# Patient Record
Sex: Female | Born: 2000 | Race: White | Hispanic: No | Marital: Single | Smoking: Former smoker
Health system: Southern US, Community
[De-identification: ages and names within clinical notes are randomized; demographics above are authoritative.]

## PROBLEM LIST (undated history)

## (undated) DIAGNOSIS — N946 Dysmenorrhea, unspecified: Secondary | ICD-10-CM

## (undated) DIAGNOSIS — G43009 Migraine without aura, not intractable, without status migrainosus: Secondary | ICD-10-CM

## (undated) DIAGNOSIS — E88819 Insulin resistance, unspecified: Secondary | ICD-10-CM

## (undated) DIAGNOSIS — F329 Major depressive disorder, single episode, unspecified: Secondary | ICD-10-CM

## (undated) DIAGNOSIS — F32A Depression, unspecified: Secondary | ICD-10-CM

## (undated) DIAGNOSIS — F419 Anxiety disorder, unspecified: Secondary | ICD-10-CM

## (undated) DIAGNOSIS — J45909 Unspecified asthma, uncomplicated: Secondary | ICD-10-CM

## (undated) DIAGNOSIS — S99919A Unspecified injury of unspecified ankle, initial encounter: Secondary | ICD-10-CM

## (undated) HISTORY — DX: Major depressive disorder, single episode, unspecified: F32.9

## (undated) HISTORY — PX: WISDOM TOOTH EXTRACTION: SHX21

## (undated) HISTORY — PX: APPENDECTOMY: SHX54

## (undated) HISTORY — DX: Migraine without aura, not intractable, without status migrainosus: G43.009

## (undated) HISTORY — DX: Anxiety disorder, unspecified: F41.9

## (undated) HISTORY — PX: EVALUATION UNDER ANESTHESIA WITH TEAR DUCT PROBING: SHX5620

## (undated) HISTORY — DX: Dysmenorrhea, unspecified: N94.6

## (undated) HISTORY — DX: Insulin resistance, unspecified: E88.819

## (undated) HISTORY — DX: Depression, unspecified: F32.A

---
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
## 2021-01-14 ENCOUNTER — Other Ambulatory Visit (HOSPITAL_COMMUNITY): Payer: Self-pay | Admitting: Psychiatry

## 2021-01-18 ENCOUNTER — Other Ambulatory Visit (HOSPITAL_COMMUNITY): Payer: Self-pay | Admitting: Psychiatry

## 2021-01-20 ENCOUNTER — Other Ambulatory Visit (HOSPITAL_COMMUNITY): Payer: Self-pay | Admitting: Psychiatry

## 2021-02-11 ENCOUNTER — Other Ambulatory Visit (HOSPITAL_COMMUNITY): Payer: Self-pay | Admitting: Psychiatry

## 2021-04-08 IMAGING — US US ABDOMEN LIMITED
1 series · 14 of 18 positions shown · non-contrast
Comparison: None.

CLINICAL DATA: Right lower quadrant pain for a day. Elevated white
blood cell count.

EXAM:
ULTRASOUND ABDOMEN LIMITED
TECHNIQUE: Gray scale imaging of the right lower quadrant was performed to
evaluate for suspected appendicitis. Standard imaging planes and
graded compression technique were utilized.

[Series 1: us abdomen limited · 18 acquisitions, 14 frames shown]
[im 1/18]
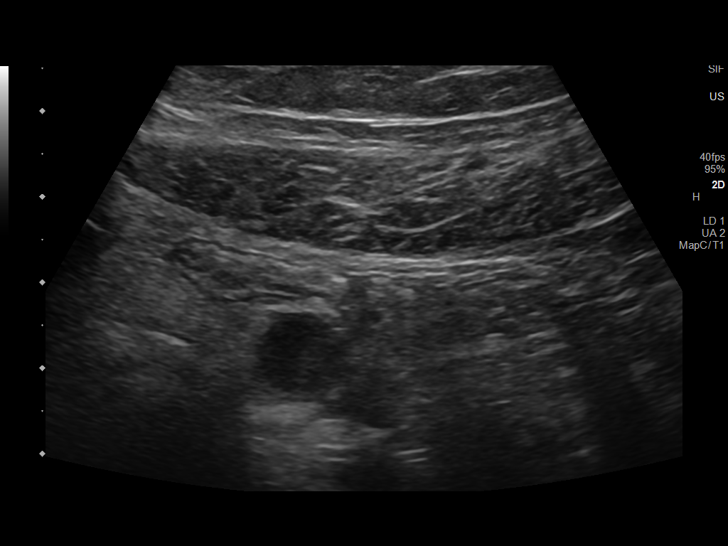
[im 2/18]
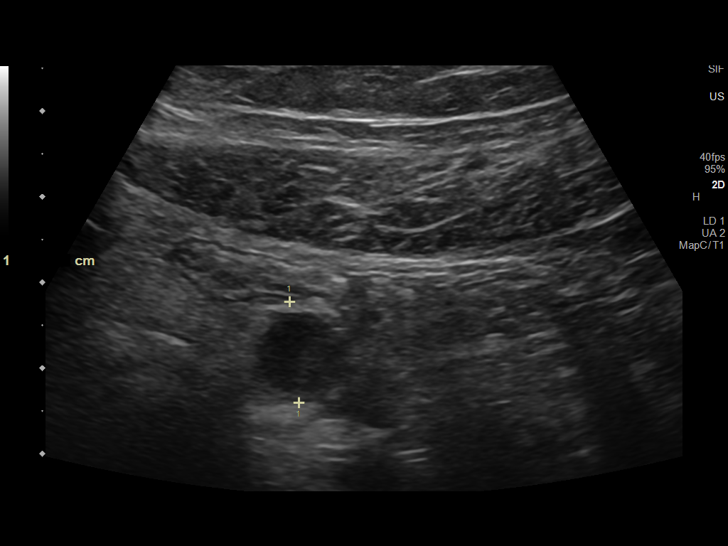
[im 4/18]
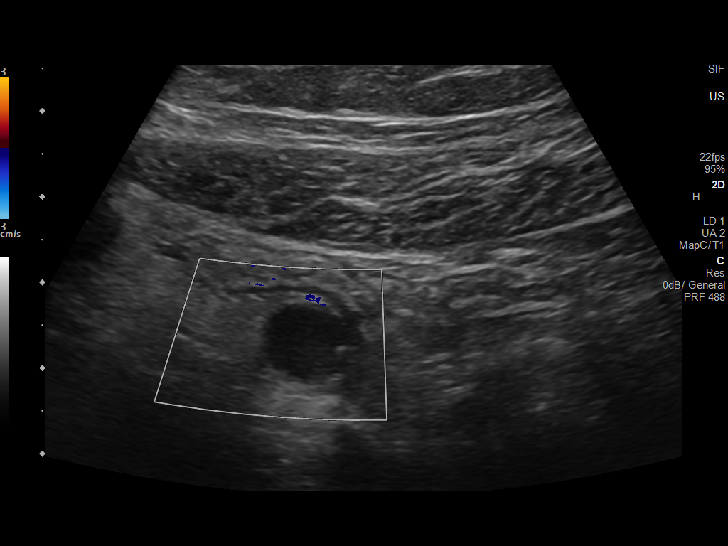
[im 5/18]
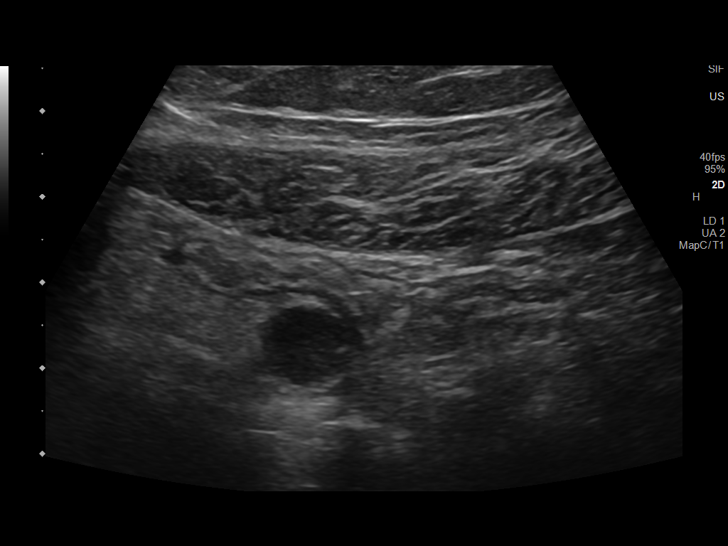
[im 6/18]
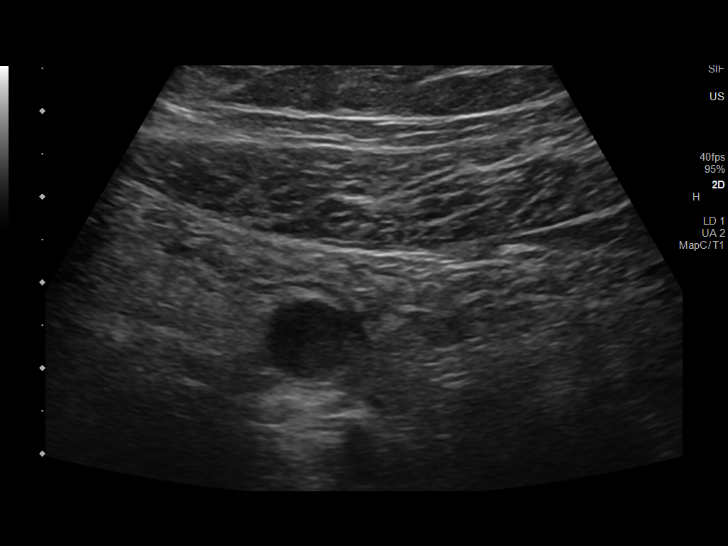
[im 8/18]
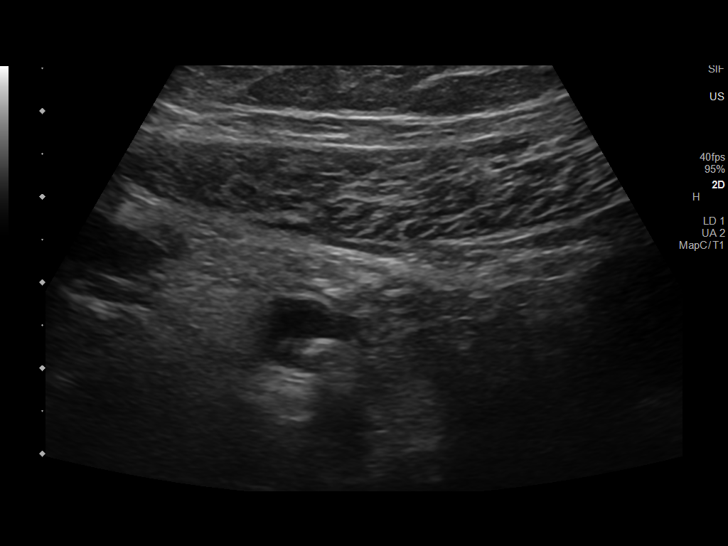
[im 9/18]
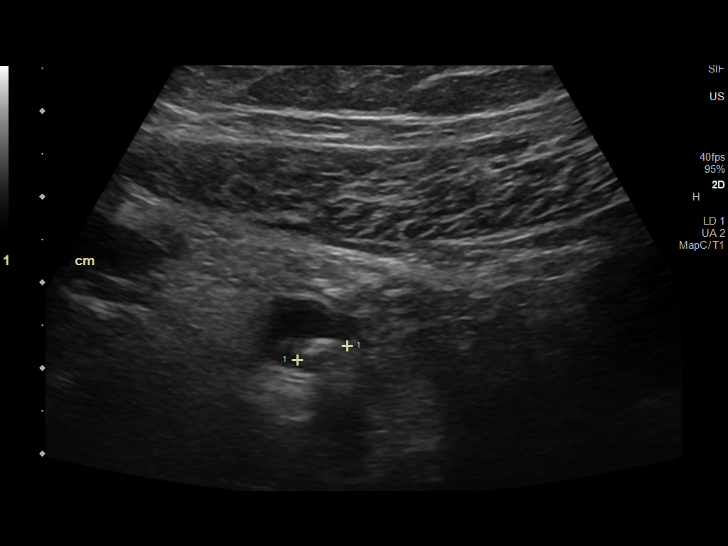
[im 10/18]
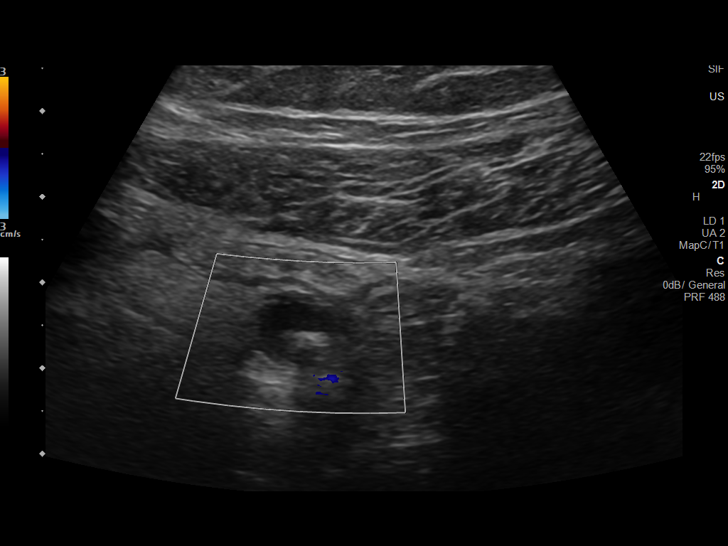
[im 11/18]
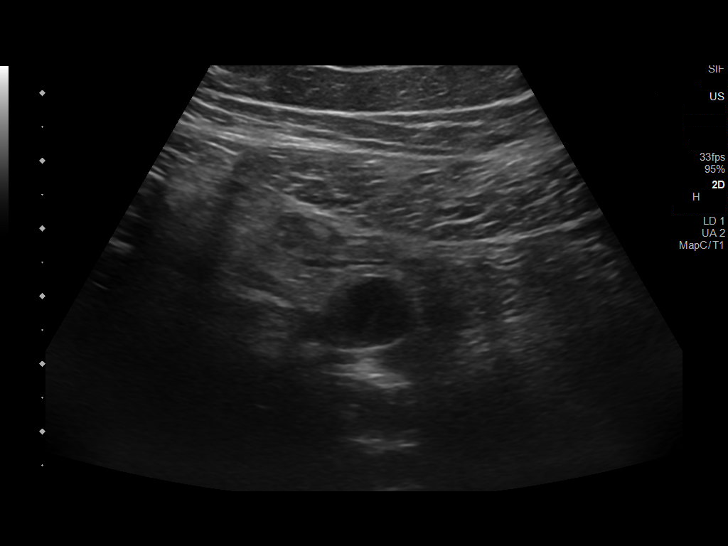
[im 13/18]
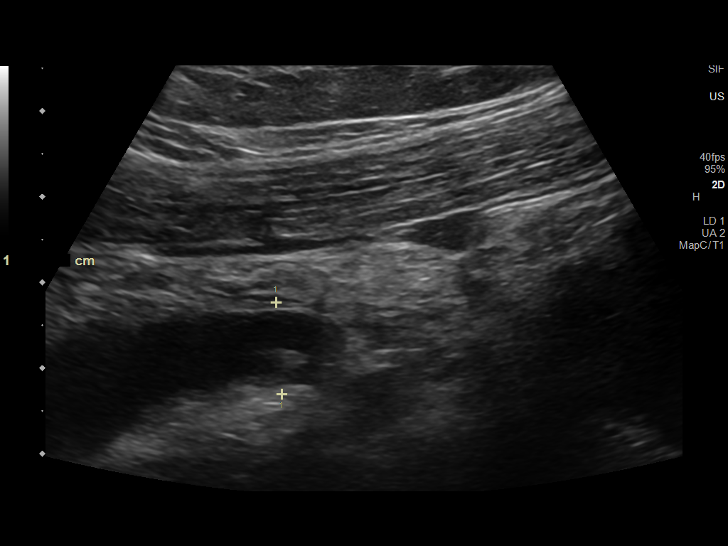
[im 14/18]
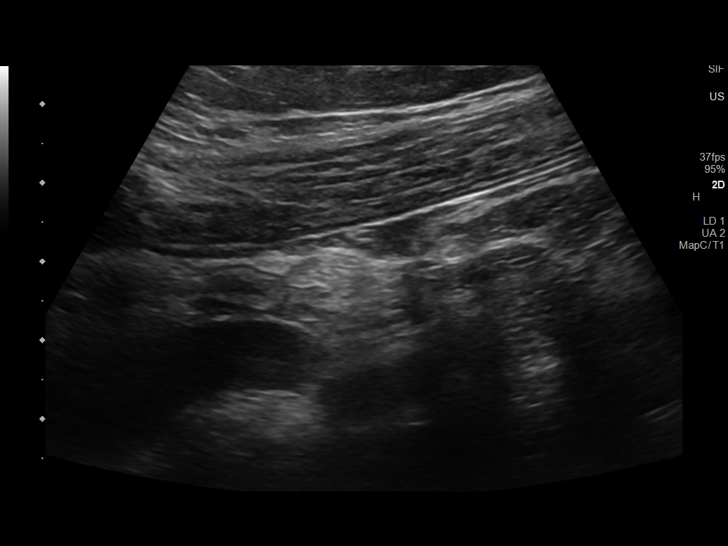
[im 15/18]
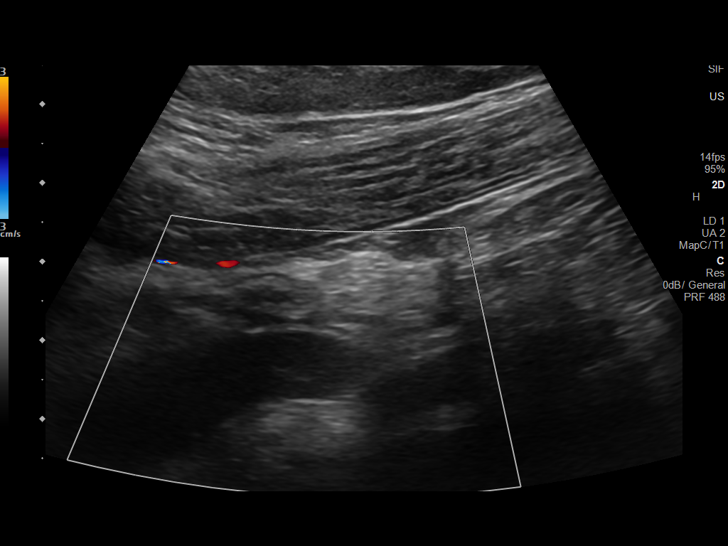
[im 17/18]
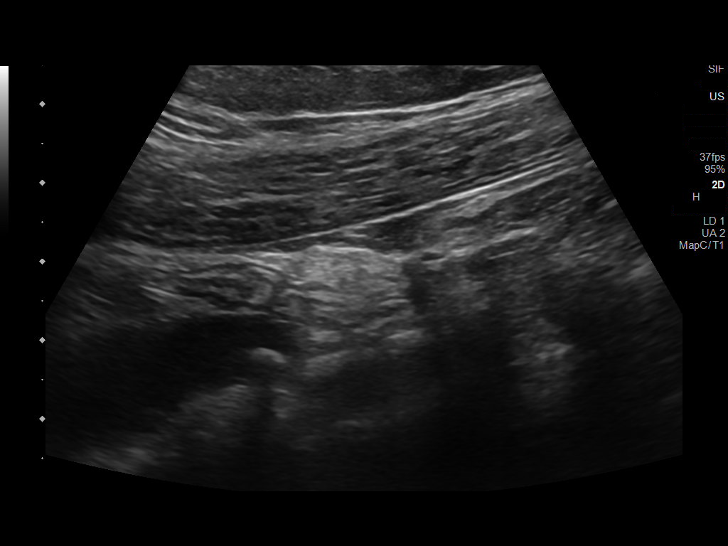
[im 18/18]
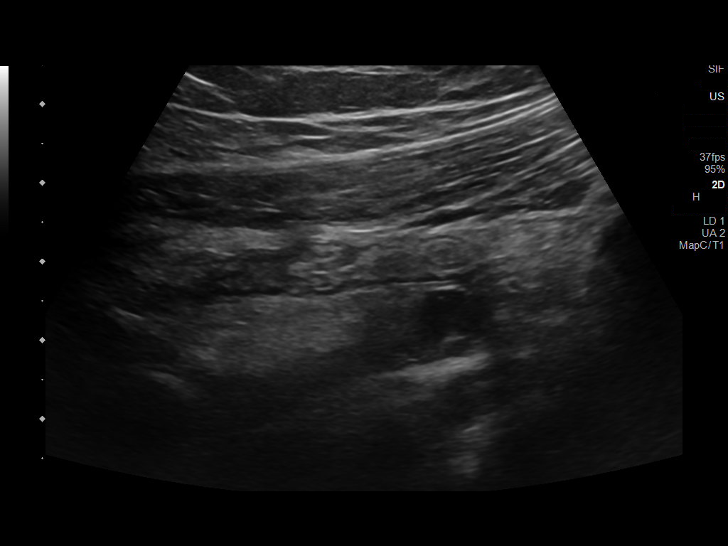

[14 of 18 positions shown; findings below may reference images not displayed]

FINDINGS: There is a blind ended tubular structure in the right lower quadrant
measuring 12 mm in diameter with some wall thickening and probably a
small amount of periappendiceal fluid. An apparent appendicoliths is
identified. This structure is noncompressible. There is tenderness
in this region with transducer pressure.
IMPRESSION: The findings are consistent with acute appendicitis with an
appendicolith in the mid appendix. The appendix measures 12 mm in
diameter with wall thickening and a small amount of adjacent fluid.

## 2021-09-08 ENCOUNTER — Encounter: Payer: Self-pay | Admitting: Obstetrics and Gynecology

## 2021-09-08 ENCOUNTER — Ambulatory Visit: Payer: BC Managed Care – PPO | Admitting: Obstetrics and Gynecology

## 2021-09-08 VITALS — BP 120/78 | HR 76 | Ht 65.0 in | Wt 136.0 lb

## 2021-09-08 DIAGNOSIS — N926 Irregular menstruation, unspecified: Secondary | ICD-10-CM | POA: Diagnosis not present

## 2021-09-08 DIAGNOSIS — Z113 Encounter for screening for infections with a predominantly sexual mode of transmission: Secondary | ICD-10-CM

## 2021-09-08 LAB — PREGNANCY, URINE: Preg Test, Ur: NEGATIVE

## 2021-09-08 NOTE — Progress Notes (Signed)
GYNECOLOGY  VISIT ?  ?HPI: ?21 y.o.   Single White or Caucasian Not Hispanic or Latino  female   ?G0P0000 with Patient's last menstrual period was 09/05/2021.   ?here for  STD testing.   ?Recently with an abnormal cycle, just spotted. She has had some missed pills.  ? ?She is done with her first year in college, [employer].  ? ?GYNECOLOGIC HISTORY: ?Patient's last menstrual period was 09/05/2021. ?Contraception:OCP ?Menopausal hormone therapy: none  ?       ?OB History   ? ? Gravida  ?0  ? Para  ?0  ? Term  ?0  ? Preterm  ?0  ? AB  ?0  ? Living  ?0  ?  ? ? SAB  ?0  ? IAB  ?0  ? Ectopic  ?0  ? Multiple  ?0  ? Live Births  ?0  ?   ?  ?  ?    ? ?Patient Active Problem List  ? Diagnosis Date Noted  ? Major depressive disorder, recurrent episode, in full remission (HCC) 11/12/2019  ? GAD (generalized anxiety disorder) 11/12/2019  ? Acute appendicitis 12/30/2018  ? ? ?Past Medical History:  ?Diagnosis Date  ? Ankle injury   ? Anxiety   ? Asthma   ? Depression   ? Menstrual cramps   ? Migraine without aura   ? ? ?Past Surgical History:  ?Procedure Laterality Date  ? EVALUATION UNDER ANESTHESIA WITH TEAR DUCT PROBING    ? LAPAROSCOPIC APPENDECTOMY N/A 12/30/2018  ? Procedure: APPENDECTOMY LAPAROSCOPIC;  Surgeon: Leonia Corona, MD;  [facility];  Service: Pediatrics;  Laterality: N/A;  ? ? ?Current Outpatient Medications  ?Medication Sig Dispense Refill  ? buPROPion (WELLBUTRIN XL) 300 MG 24 hr tablet TAKE 1 TABLET BY MOUTH EVERY DAY IN THE MORNING 30 tablet 0  ? busPIRone (BUSPAR) 30 MG tablet Take 30 mg by mouth 2 (two) times daily.    ? drospirenone-ethinyl estradiol (NIKKI) 3-0.02 MG tablet Take 1 tablet by mouth daily. 84 tablet 3  ? hydrOXYzine (VISTARIL) 25 MG capsule TAKE 1 CAPSULE BY MOUTH EVERY EVENING AS NEEDED FOR ANXIETY/INSOMNIA 90 capsule 2  ? QVAR REDIHALER 80 MCG/ACT inhaler SMARTSIG:2 Puff(s) By Mouth Twice Daily    ? ?No current facility-administered medications for this visit.  ?  ? ?ALLERGIES: Patient has  no known allergies. ? ?Family History  ?Problem Relation Age of Onset  ? Diabetes Maternal Grandfather   ? Kidney failure Paternal Grandfather   ? ? ?Social History  ? ?Socioeconomic History  ? Marital status: Single  ?  Spouse name: Not on file  ? Number of children: Not on file  ? Years of education: Not on file  ? Highest education level: Not on file  ?Occupational History  ? Not on file  ?Tobacco Use  ? Smoking status: Never  ? Smokeless tobacco: Never  ?Vaping Use  ? Vaping Use: Never used  ?Substance and Sexual Activity  ? Alcohol use: Never  ? Drug use: Never  ? Sexual activity: Yes  ?  Birth control/protection: None  ?Other Topics Concern  ? Not on file  ?Social History Narrative  ? Not on file  ? ?Social Determinants of Health  ? ?Financial Resource Strain: Not on file  ?Food Insecurity: Not on file  ?Transportation Needs: Not on file  ?Physical Activity: Not on file  ?Stress: Not on file  ?Social Connections: Not on file  ?Intimate Partner Violence: Not on file  ? ? ?  Review of Systems  ?All other systems reviewed and are negative. ? ?PHYSICAL EXAMINATION:   ? ?BP 120/78   Pulse 76   Ht 5\' 5"  (1.651 m)   Wt 136 lb (61.7 kg)   LMP 09/05/2021   SpO2 100%   BMI 22.63 kg/m?     ?General appearance: alert, cooperative and appears stated age ? ? ?Pelvic: External genitalia:  no lesions ?             Urethra:  normal appearing urethra with no masses, tenderness or lesions ?             Bartholins and Skenes: normal    ?             Vagina: normal appearing vagina with normal color. Slight amount of watery vaginal d/c (denies symptoms) ?             Cervix: no lesions ?              ?Chaperone was present for exam. ? ?1. Screening examination for STD (sexually transmitted disease) ?- SURESWAB CT/NG/T. vaginalis ?- RPR ?- HIV Antibody (routine testing w rflx) ?- Hepatitis C antibody ? ?2. Irregular menses ?- Pregnancy, urine ? ?

## 2021-09-09 LAB — RPR: RPR Ser Ql: NONREACTIVE

## 2021-09-09 LAB — HEPATITIS C ANTIBODY
Hepatitis C Ab: NONREACTIVE
SIGNAL TO CUT-OFF: 0.15 (ref <1.00)

## 2021-09-09 LAB — SURESWAB CT/NG/T. VAGINALIS
C. trachomatis RNA, TMA: NOT DETECTED
N. gonorrhoeae RNA, TMA: NOT DETECTED
Trichomonas vaginalis RNA: NOT DETECTED

## 2021-09-09 LAB — HIV ANTIBODY (ROUTINE TESTING W REFLEX): HIV 1&2 Ab, 4th Generation: NONREACTIVE

## 2021-10-31 ENCOUNTER — Telehealth: Payer: Self-pay

## 2021-10-31 ENCOUNTER — Telehealth: Payer: Self-pay | Admitting: Psychiatry

## 2021-10-31 MED ORDER — BUPROPION HCL ER (XL) 300 MG PO TB24: ORAL_TABLET | ORAL | 0 refills | Status: DC

## 2021-10-31 NOTE — Telephone Encounter (Signed)
Spoke with patient and let her know Dr. Oscar La sent Rx.  Reminded her of her AEX scheduled 11/16/21 at 11am with Dr. Oscar La.

## 2021-10-31 NOTE — Telephone Encounter (Signed)
Called patient at request of nurse to schedule new patient appointment. Previous patient of now retired provider and needs refills on meds stating she has 5 days remaining. First available appointment is 12/02/2021.  Supplied number and address of [facility] to explore urgent refill request and scheduled appointment for 12/02/2021.

## 2021-10-31 NOTE — Telephone Encounter (Signed)
Patient and her Mom (in background) called.  Patient is out of her antidepressant. Has only one day left.  She said her psychiatrist retired and she cannot get an appointment until January with Dr. Lafayette Dragon. Dr. Lafayette Dragon suggested she call you and see if you might give her a short term Rx until she can see him. Patient reports she is taking Wellbutrin XL 300 mg. Pharmacy confirmed.  Last AEX 11/09/21. Scheduled 11/16/2021

## 2021-11-02 ENCOUNTER — Telehealth (HOSPITAL_COMMUNITY): Payer: Self-pay | Admitting: Psychiatry

## 2021-11-09 NOTE — Progress Notes (Signed)
21 y.o. G0P0000 Single White or Caucasian Not Hispanic or Latino female here for annual exam.  She would also like STD testing.  Period Cycle (Days): 28 Period Duration (Days): 5 Period Pattern: Regular Menstrual Flow: Moderate Menstrual Control: Tampon Menstrual Control Change Freq (Hours): 6 Dysmenorrhea: (!) Moderate (cramping has been getting worse.) Dysmenorrhea Symptoms: Cramping, Diarrhea, Headache Cramps are worsening. Allergic to NSAID's.   Patient's last menstrual period was 11/02/2021.          Sexually active: Yes.    The current method of family planning is OCP (estrogen/progesterone).    Exercising: Yes.    Gym/ health club routine includes cardio. Smoker:  vaping   Health Maintenance: Pap:  n/a History of abnormal Pap:  N/A MMG:  N/A BMD:   n/a Colonoscopy: n/a TDaP:  2014 Gardasil: x2   reports that she has never smoked. She has never used smokeless tobacco. She reports that she does not drink alcohol and does not use drugs. Occasional ETOH. She is at [employer], 2 more years. Major in Criminal Justice. Wants to go into [employer].   Past Medical History:  Diagnosis Date   Ankle injury    Anxiety    Asthma    Depression    Menstrual cramps    Migraine without aura     Past Surgical History:  Procedure Laterality Date   EVALUATION UNDER ANESTHESIA WITH TEAR DUCT PROBING     LAPAROSCOPIC APPENDECTOMY N/A 12/30/2018   Procedure: APPENDECTOMY LAPAROSCOPIC;  Surgeon: Leonia Corona, MD;  Location: [facility];  Service: Pediatrics;  Laterality: N/A;    Current Outpatient Medications  Medication Sig Dispense Refill   buPROPion (WELLBUTRIN XL) 300 MG 24 hr tablet TAKE 1 TABLET BY MOUTH EVERY DAY IN THE MORNING 90 tablet 0   busPIRone (BUSPAR) 30 MG tablet Take 30 mg by mouth 2 (two) times daily.     drospirenone-ethinyl estradiol (NIKKI) 3-0.02 MG tablet Take 1 tablet by mouth daily. 84 tablet 3   hydrOXYzine (VISTARIL) 25 MG capsule TAKE 1 CAPSULE BY MOUTH EVERY EVENING AS  NEEDED FOR ANXIETY/INSOMNIA 90 capsule 2   QVAR REDIHALER 80 MCG/ACT inhaler SMARTSIG:2 Puff(s) By Mouth Twice Daily     No current facility-administered medications for this visit.    Family History  Problem Relation Age of Onset   Diabetes Maternal Grandfather    Kidney failure Paternal Grandfather     Review of Systems  All other systems reviewed and are negative.   Exam:   BP 110/68   Pulse 77   Ht 5\' 5"  (1.651 m)   Wt 136 lb (61.7 kg)   LMP 11/02/2021   SpO2 100%   BMI 22.63 kg/m   Weight change: @WEIGHTCHANGE @ Height:   Height: 5\' 5"  (165.1 cm)  Ht Readings from Last 3 Encounters:  11/16/21 5\' 5"  (1.651 m)  09/08/21 5\' 5"  (1.651 m) (61 %, Z= 0.27)*  11/09/20 5\' 5"  (1.651 m) (61 %, Z= 0.28)*   * Growth percentiles are based on CDC (Girls, 2-20 Years) data.    General appearance: alert, cooperative and appears stated age Head: Normocephalic, without obvious abnormality, atraumatic Neck: no adenopathy, supple, symmetrical, trachea midline and thyroid normal to inspection and palpation Lungs: clear to auscultation bilaterally Cardiovascular: regular rate and rhythm Breasts: normal appearance, no masses or tenderness Abdomen: soft, non-tender; non distended,  no masses,  no organomegaly Extremities: extremities normal, atraumatic, no cyanosis or edema Skin: Skin color, texture, turgor normal. No rashes or lesions Lymph nodes: Cervical,  supraclavicular, and axillary nodes normal. No abnormal inguinal nodes palpated Neurologic: Grossly normal   Pelvic: External genitalia:  no lesions              Urethra:  normal appearing urethra with no masses, tenderness or lesions              Bartholins and Skenes: normal                 Vagina: normal appearing vagina with normal color and discharge, no lesions              Cervix: no lesions               Bimanual Exam:  Uterus:  normal size, contour, position, consistency, mobility, non-tender              Adnexa: no mass,  fullness, tenderness                Carolynn Serve chaperoned for the exam.  1. Well woman exam Discussed breast self exam Discussed calcium and vit D intake No screening labs  2. Encounter for surveillance of contraceptive pills Having worsening dysmenorrhea on monthly OCP's, has an allergy to NSAID's. Discussed changing to a 3 month pill or trying an IUD. She would like to change to the 3 month pill.  - Levonorgestrel-Ethinyl Estradiol (LOSEASONIQUE) 0.1-0.02 & 0.01 MG tablet; Take 1 tablet by mouth daily.  Dispense: 91 tablet; Refill: 4  3. Dysmenorrhea Having worsening dysmenorrhea on monthly OCP's, has an allergy to NSAID's. Discussed changing to a 3 month pill or trying an IUD. She would like to change to the 3 month pill.  - Levonorgestrel-Ethinyl Estradiol (LOSEASONIQUE) 0.1-0.02 & 0.01 MG tablet; Take 1 tablet by mouth daily.  Dispense: 91 tablet; Refill: 4  4. Screening examination for STD (sexually transmitted disease) Declines blood work - SURESWAB CT/NG/T. Vaginalis -Condom use discussed  5. Immunization due - HPV 9-valent vaccine,Recombinat

## 2021-11-16 ENCOUNTER — Ambulatory Visit (INDEPENDENT_AMBULATORY_CARE_PROVIDER_SITE_OTHER): Payer: BC Managed Care – PPO | Admitting: Obstetrics and Gynecology

## 2021-11-16 ENCOUNTER — Encounter: Payer: Self-pay | Admitting: Obstetrics and Gynecology

## 2021-11-16 VITALS — BP 110/68 | HR 77 | Ht 65.0 in | Wt 136.0 lb

## 2021-11-16 DIAGNOSIS — Z3041 Encounter for surveillance of contraceptive pills: Secondary | ICD-10-CM

## 2021-11-16 DIAGNOSIS — Z01419 Encounter for gynecological examination (general) (routine) without abnormal findings: Secondary | ICD-10-CM

## 2021-11-16 DIAGNOSIS — Z23 Encounter for immunization: Secondary | ICD-10-CM | POA: Diagnosis not present

## 2021-11-16 DIAGNOSIS — N946 Dysmenorrhea, unspecified: Secondary | ICD-10-CM

## 2021-11-16 DIAGNOSIS — Z113 Encounter for screening for infections with a predominantly sexual mode of transmission: Secondary | ICD-10-CM

## 2021-11-16 MED ORDER — LEVONORGEST-ETH ESTRAD 91-DAY 0.1-0.02 & 0.01 MG PO TABS: 1.0000 | ORAL_TABLET | Freq: Every day | ORAL | 4 refills | Status: DC

## 2021-11-16 NOTE — Patient Instructions (Signed)

## 2021-11-17 LAB — SURESWAB CT/NG/T. VAGINALIS
C. trachomatis RNA, TMA: NOT DETECTED
N. gonorrhoeae RNA, TMA: NOT DETECTED
Trichomonas vaginalis RNA: NOT DETECTED

## 2021-12-02 ENCOUNTER — Ambulatory Visit (HOSPITAL_COMMUNITY): Payer: BC Managed Care – PPO | Admitting: Psychiatry

## 2021-12-18 ENCOUNTER — Other Ambulatory Visit: Payer: Self-pay | Admitting: Obstetrics and Gynecology

## 2021-12-18 DIAGNOSIS — Z3041 Encounter for surveillance of contraceptive pills: Secondary | ICD-10-CM

## 2021-12-19 NOTE — Telephone Encounter (Signed)
Refill request received for OCP Nikki.   Per review of AEX 11/16/21, changed to [facility].   Refill refused.   Routing to Dr. Shirley Friar.   Encounter closed.

## 2021-12-30 ENCOUNTER — Telehealth: Payer: Self-pay | Admitting: *Deleted

## 2021-12-30 NOTE — Telephone Encounter (Signed)
Patient called stating she still hasn't found a psychiatrists yet. She asked if you would refill her Buspar 30 mg tablet for her? Please advise

## 2022-01-03 ENCOUNTER — Other Ambulatory Visit: Payer: Self-pay | Admitting: Obstetrics and Gynecology

## 2022-01-03 MED ORDER — BUSPIRONE HCL 30 MG PO TABS: 30.0000 mg | ORAL_TABLET | Freq: Two times a day (BID) | ORAL | 0 refills | Status: DC

## 2022-01-03 NOTE — Telephone Encounter (Signed)
Please let her know that I have called in a 3 month supply to give her time to establish care with Psychiatrist. Please send her a list of Psychiatrists.

## 2022-01-03 NOTE — Telephone Encounter (Signed)
Patient called back and said she would prefer Rx sent to [facility] [place] location patient she is currently at school now.  Patient said she does have an appointment in Jan 2024 Psychiatrists. But would like a names.I told her I would send via my chart.

## 2022-01-03 NOTE — Telephone Encounter (Signed)
Left detailed message on patient voicemail per DPR access.  Asked her to call if she needs Psychiatrists

## 2022-01-25 ENCOUNTER — Other Ambulatory Visit: Payer: Self-pay | Admitting: Obstetrics and Gynecology

## 2022-02-03 ENCOUNTER — Inpatient Hospital Stay (HOSPITAL_COMMUNITY)
Admission: RE | Admit: 2022-02-03 | Discharge: 2022-02-08 | DRG: 885 | Disposition: A | Discharge status: Home / Self Care | Payer: BC Managed Care – PPO | Source: Intra-hospital | Attending: Psychiatry | Admitting: Psychiatry

## 2022-02-03 ENCOUNTER — Ambulatory Visit (HOSPITAL_COMMUNITY)
Admission: EM | Admit: 2022-02-03 | Discharge: 2022-02-03 | Disposition: A | Discharge status: Other Acute Inpatient Hospital | Payer: BC Managed Care – PPO | Attending: Behavioral Health | Admitting: Behavioral Health

## 2022-02-03 ENCOUNTER — Ambulatory Visit (HOSPITAL_COMMUNITY)
Admission: EM | Admit: 2022-02-03 | Discharge: 2022-02-03 | Disposition: A | Discharge status: Home / Self Care | Payer: BC Managed Care – PPO | Attending: Behavioral Health | Admitting: Behavioral Health

## 2022-02-03 DIAGNOSIS — F411 Generalized anxiety disorder: Secondary | ICD-10-CM

## 2022-02-03 DIAGNOSIS — J45909 Unspecified asthma, uncomplicated: Secondary | ICD-10-CM | POA: Diagnosis present

## 2022-02-03 DIAGNOSIS — Z79899 Other long term (current) drug therapy: Secondary | ICD-10-CM

## 2022-02-03 DIAGNOSIS — Z1152 Encounter for screening for COVID-19: Secondary | ICD-10-CM | POA: Diagnosis not present

## 2022-02-03 DIAGNOSIS — F3342 Major depressive disorder, recurrent, in full remission: Secondary | ICD-10-CM | POA: Insufficient documentation

## 2022-02-03 DIAGNOSIS — F331 Major depressive disorder, recurrent, moderate: Secondary | ICD-10-CM | POA: Diagnosis present

## 2022-02-03 DIAGNOSIS — R45851 Suicidal ideations: Secondary | ICD-10-CM | POA: Diagnosis present

## 2022-02-03 DIAGNOSIS — E282 Polycystic ovarian syndrome: Secondary | ICD-10-CM | POA: Diagnosis present

## 2022-02-03 DIAGNOSIS — F17290 Nicotine dependence, other tobacco product, uncomplicated: Secondary | ICD-10-CM | POA: Diagnosis present

## 2022-02-03 DIAGNOSIS — Z20822 Contact with and (suspected) exposure to covid-19: Secondary | ICD-10-CM | POA: Diagnosis present

## 2022-02-03 DIAGNOSIS — F332 Major depressive disorder, recurrent severe without psychotic features: Secondary | ICD-10-CM | POA: Diagnosis present

## 2022-02-03 LAB — CBC WITH DIFFERENTIAL/PLATELET
Abs Immature Granulocytes: 0.01 10*3/uL (ref 0.00–0.07)
Basophils Absolute: 0 10*3/uL (ref 0.0–0.1)
Basophils Relative: 1 %
Eosinophils Absolute: 0.1 10*3/uL (ref 0.0–0.5)
Eosinophils Relative: 2 %
HCT: 40.9 % (ref 36.0–46.0)
Hemoglobin: 13.6 g/dL (ref 12.0–15.0)
Immature Granulocytes: 0 %
Lymphocytes Relative: 30 %
Lymphs Abs: 1.9 10*3/uL (ref 0.7–4.0)
MCH: 29.9 pg (ref 26.0–34.0)
MCHC: 33.3 g/dL (ref 30.0–36.0)
MCV: 89.9 fL (ref 80.0–100.0)
Monocytes Absolute: 0.4 10*3/uL (ref 0.1–1.0)
Monocytes Relative: 7 %
Neutro Abs: 3.8 10*3/uL (ref 1.7–7.7)
Neutrophils Relative %: 60 %
Platelets: 316 10*3/uL (ref 150–400)
RBC: 4.55 MIL/uL (ref 3.87–5.11)
RDW: 11.9 % (ref 11.5–15.5)
WBC: 6.2 10*3/uL (ref 4.0–10.5)
nRBC: 0 % (ref 0.0–0.2)

## 2022-02-03 LAB — COMPREHENSIVE METABOLIC PANEL
ALT: 12 U/L (ref 0–44)
AST: 14 U/L — ABNORMAL LOW (ref 15–41)
Albumin: 4.1 g/dL (ref 3.5–5.0)
Alkaline Phosphatase: 36 U/L — ABNORMAL LOW (ref 38–126)
Anion gap: 8 (ref 5–15)
BUN: 11 mg/dL (ref 6–20)
CO2: 25 mmol/L (ref 22–32)
Calcium: 9.7 mg/dL (ref 8.9–10.3)
Chloride: 105 mmol/L (ref 98–111)
Creatinine, Ser: 0.8 mg/dL (ref 0.44–1.00)
GFR, Estimated: >60 mL/min (ref >60)
Glucose, Bld: 60 mg/dL — ABNORMAL LOW (ref 70–99)
Potassium: 4.1 mmol/L (ref 3.5–5.1)
Sodium: 138 mmol/L (ref 135–145)
Total Bilirubin: 0.4 mg/dL (ref 0.3–1.2)
Total Protein: 7.3 g/dL (ref 6.5–8.1)

## 2022-02-03 LAB — LIPID PANEL
Cholesterol: 222 mg/dL — ABNORMAL HIGH (ref 0–200)
HDL: 54 mg/dL (ref >40)
LDL Cholesterol: 121 mg/dL — ABNORMAL HIGH (ref 0–99)
Total CHOL/HDL Ratio: 4.1 RATIO
Triglycerides: 237 mg/dL — ABNORMAL HIGH (ref <150)
VLDL: 47 mg/dL — ABNORMAL HIGH (ref 0–40)

## 2022-02-03 LAB — POCT URINE DRUG SCREEN - MANUAL ENTRY (I-SCREEN)
POC Amphetamine UR: NOT DETECTED
POC Buprenorphine (BUP): NOT DETECTED
POC Cocaine UR: NOT DETECTED
POC Marijuana UR: NOT DETECTED
POC Methadone UR: NOT DETECTED
POC Methamphetamine UR: NOT DETECTED
POC Morphine: NOT DETECTED
POC Oxazepam (BZO): NOT DETECTED
POC Oxycodone UR: NOT DETECTED
POC Secobarbital (BAR): NOT DETECTED

## 2022-02-03 LAB — ETHANOL: Alcohol, Ethyl (B): <10 mg/dL (ref <10)

## 2022-02-03 LAB — POC SARS CORONAVIRUS 2 AG: SARSCOV2ONAVIRUS 2 AG: NEGATIVE

## 2022-02-03 LAB — TSH: TSH: 0.358 u[IU]/mL (ref 0.350–4.500)

## 2022-02-03 LAB — POCT PREGNANCY, URINE: Preg Test, Ur: NEGATIVE

## 2022-02-03 LAB — RESP PANEL BY RT-PCR (FLU A&B, COVID) ARPGX2
Influenza A by PCR: NEGATIVE
Influenza B by PCR: NEGATIVE
SARS Coronavirus 2 by RT PCR: NEGATIVE

## 2022-02-03 LAB — HEMOGLOBIN A1C
Hgb A1c MFr Bld: 4.4 % — ABNORMAL LOW (ref 4.8–5.6)
Mean Plasma Glucose: 79.58 mg/dL

## 2022-02-03 MED ORDER — ACETAMINOPHEN 325 MG PO TABS
650.0000 mg | ORAL_TABLET | Freq: Four times a day (QID) | ORAL | Status: DC | PRN
  Administered 2022-02-04 – 2022-02-08 (×2): 650 mg via ORAL
  Filled 2022-02-03 (×2): qty 2

## 2022-02-03 MED ORDER — LEVONORGEST-ETH ESTRAD 91-DAY 0.1-0.02 & 0.01 MG PO TABS
1.0000 | ORAL_TABLET | Freq: Every day | ORAL | Status: DC
  Administered 2022-02-03: 1 via ORAL

## 2022-02-03 MED ORDER — MAGNESIUM HYDROXIDE 400 MG/5ML PO SUSP: 30.0000 mL | Freq: Every day | ORAL | Status: DC | PRN

## 2022-02-03 MED ORDER — BUSPIRONE HCL 15 MG PO TABS
30.0000 mg | ORAL_TABLET | Freq: Two times a day (BID) | ORAL | Status: DC
  Administered 2022-02-04 – 2022-02-08 (×9): 30 mg via ORAL
  Filled 2022-02-03 (×13): qty 2

## 2022-02-03 MED ORDER — TRAZODONE HCL 50 MG PO TABS: 50.0000 mg | ORAL_TABLET | Freq: Every evening | ORAL | Status: DC | PRN

## 2022-02-03 MED ORDER — ALBUTEROL SULFATE HFA 108 (90 BASE) MCG/ACT IN AERS
2.0000 | INHALATION_SPRAY | Freq: Four times a day (QID) | RESPIRATORY_TRACT | Status: DC | PRN
  Filled 2022-02-03: qty 6.7

## 2022-02-03 MED ORDER — BUPROPION HCL ER (XL) 300 MG PO TB24
300.0000 mg | ORAL_TABLET | Freq: Every day | ORAL | Status: DC
  Administered 2022-02-04 – 2022-02-08 (×5): 300 mg via ORAL
  Filled 2022-02-03 (×7): qty 1

## 2022-02-03 MED ORDER — HYDROXYZINE HCL 25 MG PO TABS: 25.0000 mg | ORAL_TABLET | Freq: Three times a day (TID) | ORAL | Status: DC | PRN

## 2022-02-03 MED ORDER — ALUM & MAG HYDROXIDE-SIMETH 200-200-20 MG/5ML PO SUSP: 30.0000 mL | ORAL | Status: DC | PRN

## 2022-02-03 MED ORDER — HYDROXYZINE HCL 25 MG PO TABS
25.0000 mg | ORAL_TABLET | Freq: Three times a day (TID) | ORAL | Status: DC | PRN
  Administered 2022-02-04: 25 mg via ORAL
  Filled 2022-02-03 (×2): qty 1

## 2022-02-03 MED ORDER — LEVONORGEST-ETH ESTRAD 91-DAY 0.1-0.02 & 0.01 MG PO TABS
1.0000 | ORAL_TABLET | Freq: Every day | ORAL | Status: DC
  Administered 2022-02-05 – 2022-02-06 (×2): 1 via ORAL

## 2022-02-03 MED ORDER — BUPROPION HCL ER (XL) 300 MG PO TB24: 300.0000 mg | ORAL_TABLET | Freq: Every day | ORAL | Status: DC

## 2022-02-03 MED ORDER — BUSPIRONE HCL 15 MG PO TABS
30.0000 mg | ORAL_TABLET | Freq: Two times a day (BID) | ORAL | Status: DC
  Administered 2022-02-03: 30 mg via ORAL
  Filled 2022-02-03: qty 2

## 2022-02-03 MED ORDER — ACETAMINOPHEN 325 MG PO TABS: 650.0000 mg | ORAL_TABLET | Freq: Four times a day (QID) | ORAL | Status: DC | PRN

## 2022-02-03 MED ORDER — BECLOMETHASONE DIPROP HFA 80 MCG/ACT IN AERB: 2.0000 | INHALATION_SPRAY | Freq: Two times a day (BID) | RESPIRATORY_TRACT | Status: DC | PRN

## 2022-02-03 NOTE — ED Notes (Signed)
Discharge instructions provided and Pt stated understanding. Pt alert, orient and ambulatory prior to d/c from facility. No personal belongings to be returned from a locker. Escorted pt to the front lobby to meet her parents to d/c from facility. Safety maintained.

## 2022-02-03 NOTE — ED Notes (Signed)
[facility] called for transport to [facility] 
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
## 2022-02-03 NOTE — BH Assessment (Signed)
Per Irine Seal, RN, patient accepted to [facility] and can arrive ASAP. Patient's nurse Burna Mortimer, RN) provided updates.

## 2022-02-03 NOTE — ED Notes (Addendum)
Patient cooperative and oriented to unit. Patient reports passive SI with some self harm thoughts but no plan or intent to act. Denies HI and A/V/H. Denies any pain at this time and states she is missing her support animal that passed away (hedgehog). Patient stated it really affected her. Patient also states college has been really stressful lately along with her roommates. Patient in no current distress and remains safe on unit.

## 2022-02-03 NOTE — BH Assessment (Signed)
Darrol Angel, NP recommended inpatient treatment. Requesting night time [facility] AC Irine Seal H. RN) to review patient for admission.Patient under review at [facility].

## 2022-02-03 NOTE — BH Assessment (Signed)
Comprehensive Clinical Assessment (CCA) Note  02/03/2022 Kara Lloyd [id number] DISPOSITION: [facility] NP recommends an inpatient admission to assist with stabilization.   [facility] from 02/03/2022 in [facility] Most recent reading at 02/03/2022  2:46 PM ED from 02/03/2022 in [facility] Most recent reading at 02/03/2022 11:57 AM  C-SSRS RISK CATEGORY High Risk Low Risk      The patient demonstrates the following risk factors for suicide: Chronic risk factors for suicide include: psychiatric disorder of depression . Acute risk factors for suicide include: social withdrawal/isolation. Protective factors for this patient include: coping skills. Considering these factors, the overall suicide risk at this point appears to be high. Patient is not appropriate for outpatient follow up.   Patient is a 21 year old female that presents this date initially voluntary as a walk in to [facility] and after not meeting inpatient criteria for ongoing symptoms of MDD and GAD became upset in the lobby at the time of discharge which resulted in [place] NP initiating an IVC based on patient voicing S/I at that time and safety concerns that her mother reported to NP. Patient at this time is with IVC and meets inpatient criteria. Patient denies any immediate plan or intent to self harm at the time of this writer's assessment although patient reports ongoing S/I that "comes and goes." Patient denies any H/I or AVH. Patient denies any history of inpatient admissions associated with mental health. Patient does report one prior attempt to self harm in 2018 by overdosing. Patient denies any current SA issues. Patient does report a history of cutting with most recent episode in January of 2023 when patient stated she "cut her arms and legs." Patient does not have any visible cuts this date. Patient reports up until January she had not cut herself in over two years. 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 She receives outpatient therapy with [facility]. Patient denies having any current psychiatric OP services for medication management. She reports she is currently prescribed BuSpar 30 mg p.o. twice daily. She states that the BuSpar is prescribed by her gynecologist. She states that she also takes Wellbutrin 300 mg daily that is prescribed by her PCP. She reports poor sleep hygiene stating she has only been sleeping 3 to 4 hours a night for the last 6 months. Patient states she feels her Wellbutrin is not working as she reports  ongoing symptoms of depression as feeling unmotivated, irritable, and frequent crying spells. Patient reports current stressors to include a recent breakup, loss of an emotional support animal and stress at school.    On evaluation, patient is alert and oriented x 4. Her thought process is clear and coherent at a moderate tone. Her mood is dysphoric and affect is congruent. She has fair eye contact. She is calm and cooperative. She appears well groomed and is casually dressed. Patient does not appear to be responding to internal stimuli.    Chief Complaint: No chief complaint on file.  Visit Diagnosis: MDD recurrent without psychotic features, severe, GAD     CCA Screening, Triage and Referral (STR)  Patient Reported Information How did you hear about [place]? Self  What Is the Reason for Your Visit/Call Today? Pt presents with ongoing S/I although denies any active plan or intent. Patient was seen earlier at [facility] and got upset on discharge since she was requesting an inpatient admission. [facility] NP  spoke with mother who voiced concerns over patient's safety. White NP initiated an IVC this date  How Long Has This Been Causing You Problems? 1 wk - 1 month  What Do You Feel Would Help You the Most Today? Treatment for Depression or other mood problem   Have You Recently  Had Any Thoughts About Hurting Yourself? Yes  Are You Planning to Commit Suicide/Harm Yourself At This time? No   Have you Recently Had Thoughts About [place]? No  Are You Planning to Harm Someone at This Time? No  Explanation: No data recorded  Have You Used Any Alcohol or Drugs in the Past 24 Hours? No  How Long Ago Did You Use Drugs or Alcohol? No data recorded What Did You Use and How Much? No data recorded  Do You Currently Have a Therapist/Psychiatrist? Yes  Name of Therapist/Psychiatrist: [facility] (patient states she is inbetween therapists at this time)   Have You Been Recently Discharged From Any Mudlogger or Programs? No  Explanation of Discharge From Practice/Program: No data recorded    CCA Screening Triage Referral Assessment Type of Contact: Face-to-Face  Telemedicine Service Delivery:   Is this Initial or Reassessment? No data recorded Date Telepsych consult ordered in CHL:  No data recorded Time Telepsych consult ordered in CHL:  No data recorded Location of Assessment: [facility] Assessment Services  Provider Location: [facility] Assessment Services   Collateral Involvement: Mother gave collateral per [facility] NP   Does Patient Have a [place]? No  Legal Guardian Contact Information: No data recorded Copy of Legal Guardianship Form: No data recorded Legal Guardian Notified of Arrival: No data recorded Legal Guardian Notified of Pending Discharge: No data recorded If Minor and Not Living with Parent(s), Who has Custody? NA  Is CPS involved or ever been involved? Never  Is APS involved or ever been involved? Never   Patient Determined To Be At Risk for Harm To Self or Others Based on Review of Patient Reported Information or Presenting Complaint? Yes, for Self-Harm  Method: No data recorded Availability of Means: No data recorded Intent: No data recorded Notification Required: No data  recorded Additional Information for Danger to Others Potential: No data recorded Additional Comments for Danger to Others Potential: No data recorded Are There Guns or Other Weapons in Your Home? No data recorded Types of Guns/Weapons: No data recorded Are These Weapons Safely Secured?                            No data recorded Who Could Verify You Are Able To Have These Secured: No data recorded Do You Have any Outstanding Charges, Pending Court Dates, Parole/Probation? No data recorded Contacted To Inform of Risk of Harm To Self or Others: Other: Comment (NA)    Does Patient Present under Involuntary Commitment? Yes  IVC Papers Initial File Date: 02/03/22   [place] of Residence: [place]   Patient Currently Receiving the Following Services: Medication Management; Individual Therapy   Determination of Need: Emergent (2 hours)   Options For Referral: Inpatient Hospitalization     CCA Biopsychosocial Patient Reported Schizophrenia/Schizoaffective Diagnosis in Past: No   Strengths: Patient is willing to participate in treatment and is open to interventions   Mental Health Symptoms Depression:   Change in energy/activity; Difficulty Concentrating; Fatigue; Hopelessness   Duration of Depressive symptoms:  Duration of Depressive Symptoms: Greater than two weeks   Mania:  None   Anxiety:    Difficulty concentrating; Fatigue; Irritability   Psychosis:   None   Duration of Psychotic symptoms:    Trauma:   None   Obsessions:   None   Compulsions:   None   Inattention:   None   Hyperactivity/Impulsivity:   None   Oppositional/Defiant Behaviors:   None   Emotional Irregularity:   Chronic feelings of emptiness; Intense/unstable relationships; Mood lability   Other Mood/Personality Symptoms:   NA    Mental Status Exam Appearance and self-care  Stature:   Average   Weight:   Average weight   Clothing:   Neat/clean   Grooming:    Well-groomed   Cosmetic use:   Age appropriate   Posture/gait:   Normal   Motor activity:   Not Remarkable   Sensorium  Attention:   Normal   Concentration:   Normal   Orientation:   X5   Recall/memory:   Normal   Affect and Mood  Affect:   Appropriate   Mood:   Anxious; Depressed   Relating  Eye contact:   Normal   Facial expression:   Depressed   Attitude toward examiner:   Cooperative   Thought and Language  Speech flow:  Clear and Coherent   Thought content:   Appropriate to Mood and Circumstances   Preoccupation:   None   Hallucinations:   None   Organization:  No data recorded  [employer] of Knowledge:   Good   Intelligence:   Above Average   Abstraction:   Normal   Judgement:   Good   Reality Testing:   Realistic   Insight:   Fair   Decision Making:   Normal   Social Functioning  Social Maturity:   Responsible   Social Judgement:   Normal   Stress  Stressors:   School; Relationship; Grief/losses   Coping Ability:   Programme researcher, broadcasting/film/video Deficits:   None   Supports:   Family     Religion: Religion/Spirituality Are You A Religious Person?: No (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) How Might This Affect Treatment?: NA  Leisure/Recreation: Leisure / Recreation Do You Have Hobbies?: Yes Leisure and Hobbies: Pt states she enjoys outdoor activities, hiking, walking  Exercise/Diet: Exercise/Diet Do You Exercise?: Yes What Type of Exercise Do You Do?: Hiking, Run/Walk How Many Times a Week Do You Exercise?: 1-3 times a week Have You Gained or Lost A Significant Amount of Weight in the Past Six Months?: No Do You Follow a Special Diet?: No Do You Have Any Trouble Sleeping?: Yes Explanation of Sleeping Difficulties: Pt states she only sleeps 3 to 4 hours a night for the last 6 months   CCA Employment/Education Employment/Work Situation: Employment / Work Situation Employment Situation:  Radio broadcast assistant Job has Been Impacted by Current Illness: No Has Patient ever Been in the [employer]?: No  Education: Education Is Patient Currently Attending School?: Yes School Currently Attending: [employer] Last Grade Completed: 12 Did You Nutritional therapist?: Yes What Type of College Degree Do you Have?: Pt is currently in college at [employer] Did You Have An Individualized Education Program (IIEP): No Did You Have Any Difficulty At School?: No Patient's Education Has Been Impacted by Current Illness: No   CCA Family/Childhood History Family and Relationship History: Family history Marital status: Single Does patient have children?: No  Childhood History:  Childhood History By whom was/is the patient raised?: Both parents Did patient suffer any verbal/emotional/physical/sexual abuse as  a child?: No Did patient suffer from severe childhood neglect?: No Has patient ever been sexually abused/assaulted/raped as an adolescent or adult?: No Was the patient ever a victim of a crime or a disaster?: No Witnessed domestic violence?: No Has patient been affected by domestic violence as an adult?: No  Child/Adolescent Assessment:     CCA Substance Use Alcohol/Drug Use: Alcohol / Drug Use Pain Medications: See MAR Prescriptions: See MAR Over the Counter: See MAR History of alcohol / drug use?: No history of alcohol / drug abuse                         ASAM's:  Six Dimensions of Multidimensional Assessment  Dimension 1:  Acute Intoxication and/or Withdrawal Potential:      Dimension 2:  Biomedical Conditions and Complications:      Dimension 3:  Emotional, Behavioral, or Cognitive Conditions and Complications:     Dimension 4:  Readiness to Change:     Dimension 5:  Relapse, Continued use, or Continued Problem Potential:     Dimension 6:  Recovery/Living Environment:     ASAM Severity Score:    ASAM Recommended Level of Treatment:     Substance use Disorder (SUD)     Recommendations for Services/Supports/Treatments:    Discharge Disposition:    DSM5 Diagnoses: Patient Active Problem List   Diagnosis Date Noted   Major depressive disorder, recurrent episode, in full remission ([place]) 11/12/2019   GAD (generalized anxiety disorder) 11/12/2019   Acute appendicitis 12/30/2018     Referrals to Alternative Service(s): Referred to Alternative Service(s):   Place:   Date:   Time:    Referred to Alternative Service(s):   Place:   Date:   Time:    Referred to Alternative Service(s):   Place:   Date:   Time:    Referred to Alternative Service(s):   Place:   Date:   Time:     Mamie Nick, LCAS

## 2022-02-03 NOTE — Progress Notes (Addendum)
Inpatient Behavioral Health  Pt meets inpatient criteria per Darrol Angel, NP. Pt under review via [facility]. Referral was sent to the following facilities;    Destination Service Provider Address Phone Fax  [facility]  [address], [place] [address] [phone] [place], [place] [address] [phone]  [facility]  [address] [place] [address] [place]  [facility]  [address], [place] [address] [phone]  [place] [address], [place] [address] [phone]  [facility]  [address] [place] [address] [phone] [facility]  [address] [place] [address] [phone] [facility]  [address], [place] [address] [place]  [address] [place] [address] [phone] [facility]  [address] [place] [address] [phone] [facility]  [address] [place] [address] [phone] [place]  [place], [place] [address] [place]  [facility]  [address], [place] [address] [phone]   Situation ongoing,  CSW will follow up.   Benjaman Kindler, MSW, LCSWA 02/03/2022  @ 3:53 PM

## 2022-02-03 NOTE — ED Triage Notes (Signed)
Pt presents to [facility] accompanied by her parents due to worsening depression symptoms. Pt reports multiple stressors; her emotional support animal passed away, recent breakup, stress from schoolwork, and external drama with friends. Pt states she would like to psychiatry resources and medication management. Pt reports seeing her therapist weekly last appointment was 9/29. Pt reports passive SI, no intent or plan. Pt denies HI and AVH.

## 2022-02-03 NOTE — ED Notes (Signed)
Pt is A&Ox 4, lying on stretcher watching watching. Pt was concern about the birth pill she will be taking, if it would be the same brand or kind of birth control. Writer showed her the birth control pill, and pt confirmed it is exactly the one from home. Pt is calm and cooperative. Will continue to monitor.
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
## 2022-02-03 NOTE — ED Notes (Signed)
Offsite Distribution called to collect STAT specimens and to deliver to [facility]. Specimens collected via R AC X1 stick, pt tolerated well.

## 2022-02-03 NOTE — ED Notes (Signed)
Patient arrive on unit. Patient calm and cooperative. Patient is eating meal. Patient safe on unit with continued monitoring.

## 2022-02-03 NOTE — Discharge Instructions (Addendum)
Discharge recommendations:   Please see information below for follow-up appointment with psychiatry and therapy.  Please follow up with your primary care provider for all medical related needs.   Therapy: We recommend that patient participate in individual therapy to address current stressors, anxiety and depression.   Safety:  The patient should abstain from use of illicit substances/drugs and abuse of any medications. If symptoms worsen or do not continue to improve or if the patient becomes actively suicidal or homicidal then it is recommended that the patient return to the closest hospital emergency department, the [facility], or call 911 for further evaluation and treatment. National Suicide Prevention Lifeline 1-800-SUICIDE or 785-635-3051.  About 988 988 offers 24/7 access to trained crisis counselors who can help people experiencing mental health-related distress. People can call or text 988 or chat 988lifeline.org for themselves or if they are worried about a loved one who may need crisis support.     Please contact one of the following facilities to start medication management and therapy services:   [facility] [address]  [place], [place] [address] [phone]   [place]  [address] [place], [place] [address] [phone]  [place]  [address] [place], [place] [address] [phone]  [facility]  [address]  [place], [place] [address] [phone]  [facility]  [place], [place] [address] [phone]  [place]  [address],  [place], [place] [address] [phone]

## 2022-02-03 NOTE — ED Notes (Signed)
Report was called to RN Kathlee Nations, pt accepted to [facility]. Pt is IVC pending transport at 11pm [facility].

## 2022-02-04 ENCOUNTER — Encounter (HOSPITAL_COMMUNITY): Payer: Self-pay | Admitting: Nurse Practitioner

## 2022-02-04 ENCOUNTER — Other Ambulatory Visit: Payer: Self-pay

## 2022-02-04 MED ORDER — NICOTINE 21 MG/24HR TD PT24
21.0000 mg | MEDICATED_PATCH | Freq: Every day | TRANSDERMAL | Status: DC
  Filled 2022-02-04 (×4): qty 1

## 2022-02-04 MED ORDER — OXCARBAZEPINE 150 MG PO TABS
150.0000 mg | ORAL_TABLET | Freq: Two times a day (BID) | ORAL | Status: DC
  Administered 2022-02-04 – 2022-02-08 (×9): 150 mg via ORAL
  Filled 2022-02-04 (×13): qty 1

## 2022-02-04 NOTE — Progress Notes (Signed)
   02/04/22 2152  Psych Admission Type (Psych Patients Only)  Admission Status Involuntary  Psychosocial Assessment  Patient Complaints Anxiety  Eye Contact Fair  Facial Expression Animated  Affect Appropriate to circumstance  Speech Logical/coherent  Interaction Assertive  Motor Activity Other (Comment) (WDL)  Appearance/Hygiene Unremarkable  Behavior Characteristics Appropriate to situation  Mood Pleasant  Thought Process  Coherency WDL  Content WDL  Delusions None reported or observed  Perception WDL  Hallucination None reported or observed  Judgment Poor  Confusion None  Danger to Self  Current suicidal ideation? Denies  Self-Injurious Behavior No self-injurious ideation or behavior indicators observed or expressed   Agreement Not to Harm Self Yes  Description of Agreement verbal  Danger to Others  Danger to Others None reported or observed
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
## 2022-02-04 NOTE — [facility] Group Notes (Signed)
Psychoeducational Group Note  Date: 02/04/22 Time: 0900-1000    Goal Setting   Purpose of Group: This group helps to provide patients with the steps of setting Lloyd goal that is specific, measurable, attainable, realistic and time specific. Lloyd discussion on how we keep ourselves stuck with negative self talk. Homework given for Patients to write 30 positive attributes about themselves.    Participation Level:  Active  Participation Quality:  Appropriate  Affect:  Appropriate  Cognitive:  Appropriate  Insight:  Improving  Engagement in Group:  Engaged  Additional Comments:  rates her energy at Lloyd 5/10, Participated in the group  Kara Lloyd

## 2022-02-04 NOTE — Progress Notes (Signed)
Adult Psychoeducational Group Note  Date:  02/04/2022 Time:  9:04 PM  Group Topic/Focus:  Wrap-Up Group:   The focus of this group is to help patients review their daily goal of treatment and discuss progress on daily workbooks.  Participation Level:  Active  Participation Quality:  Appropriate  Affect:  Appropriate  Cognitive:  Appropriate  Insight: Appropriate  Engagement in Group:  Engaged  Modes of Intervention:  Education  Additional Comments:  Patient attended and participated in group tonight. She reports that today she learn her diagnosis and she started some new medication for ist.  Debe Coder 02/04/2022, 9:04 PM

## 2022-02-04 NOTE — Progress Notes (Signed)
   02/04/22 0042  Psych Admission Type (Psych Patients Only)  Admission Status Voluntary  Psychosocial Assessment  Patient Complaints Anxiety;Sleep disturbance  Eye Contact Fair  Facial Expression Anxious  Affect Appropriate to circumstance  Speech Logical/coherent  Interaction Assertive  Motor Activity Other (Comment) (WDL)  Appearance/Hygiene Unremarkable  Behavior Characteristics Appropriate to situation  Mood Anxious  Thought Process  Coherency WDL  Content WDL  Delusions None reported or observed  Perception WDL  Hallucination None reported or observed  Judgment Poor  Confusion None  Danger to Self  Current suicidal ideation? Denies  Self-Injurious Behavior No self-injurious ideation or behavior indicators observed or expressed   Agreement Not to Harm Self Yes  Description of Agreement verbal  Danger to Others  Danger to Others None reported or observed

## 2022-02-04 NOTE — [facility] Suicide Risk Assessment (Signed)
Suicide Risk Assessment  Admission Assessment    [facility] Admission Suicide Risk Assessment   Nursing information obtained from:  Patient Demographic factors:  Adolescent or young adult, Caucasian, Unemployed Current Mental Status:  Suicidal ideation indicated by others Loss Factors:  Financial problems / change in socioeconomic status, Loss of significant relationship Historical Factors:  Family history of mental illness or substance abuse, Impulsivity Risk Reduction Factors:  Sense of responsibility to family, Living with another person, especially a relative  Total Time spent with patient: 45 minutes Principal Problem: MDD (major depressive disorder), recurrent episode, severe ([place]) Diagnosis:  Principal Problem:   MDD (major depressive disorder), recurrent episode, severe ([place]) Active Problems:   GAD (generalized anxiety disorder)  Subjective Data: Kara Lloyd is a 21 year old female with a psychiatric history of MDD-recurrent/severe, self-harm behaviors in the form of cutting, and GAD who presented under IVC from [facility] for suicidal ideation and intrusive self-harm thoughts with a plan to cut herself.   On Chart Review: Patient has not had a prior inpatient psychiatric admission, but was seen outpatient by psychiatrist, Raquel James, MD from 03/20/2017 to 09/18/2019 then by Maudry Mayhew, MD from 10/2019 to 06/2020.  Patient attempted to schedule with Berniece Andreas, MD after Dr. Montel Culver retired, but has not yet been able to see him (no appointment available until May 02, 2022).  PCP prescribes her Wellbutrin, and gynecologist prescribes her BuSpar.   Current Outpatient (Home) Medication List:  Wellbutrin XL 300 mg daily BuSpar 30 mg twice daily Hydroxyzine 25 mg nightly as needed LoSeasonique OCP nightly  Continued Clinical Symptoms:    The "Alcohol Use Disorders Identification Test", Guidelines for Use in Primary Care, Second Edition.  World Pharmacologist  [facility]). Score between 0-7:  no or low risk or alcohol related problems. Score between 8-15:  moderate risk of alcohol related problems. Score between 16-19:  high risk of alcohol related problems. Score 20 or above:  warrants further diagnostic evaluation for alcohol dependence and treatment.   CLINICAL FACTORS:   Panic Attacks Depression:   Impulsivity Insomnia Severe More than one psychiatric diagnosis Previous Psychiatric Diagnoses and Treatments   Musculoskeletal: Strength & Muscle Tone: within normal limits Gait & Station:  Patient lying in bed; not observed Patient leans: N/A  Psychiatric Specialty Exam:  Presentation  General Appearance:  Appropriate for Environment; Casual  Eye Contact: Fair  Speech: Clear and Coherent; Normal Rate  Speech Volume: Normal  Handedness: Right   Mood and Affect  Mood: Depressed; Anxious  Affect: Congruent; Depressed   Thought Process  Thought Processes: Coherent; Linear  Descriptions of Associations:Intact  Orientation:Full (Time, Place and Person)  Thought Content:Logical  History of Schizophrenia/Schizoaffective disorder:No  Duration of Psychotic Symptoms:No data recorded Hallucinations:Hallucinations: None  Ideas of Reference:None  Suicidal Thoughts:Suicidal Thoughts: No  Homicidal Thoughts:Homicidal Thoughts: No   Sensorium  Memory: Immediate Good; Recent Good  Judgment: Fair  Insight: Fair   Community education officer  Concentration: Fair  Attention Span: Fair  Recall: [place] of Knowledge: Good  Language: Good   Psychomotor Activity  Psychomotor Activity: Psychomotor Activity: Normal   Assets  Assets: Communication Skills; Desire for Improvement; Financial Resources/Insurance; Housing; Leisure Time; Physical Health; Social Support; Transportation; Vocational/Educational   Sleep  Sleep: Sleep: Poor Number of Hours of Sleep: 4.25    Physical Exam: Vitals reviewed.   Constitutional:      General: She is not in acute distress.    Appearance: Normal appearance. She is normal weight. She is not toxic-appearing.  HENT:     Head: Normocephalic and atraumatic.     Mouth/Throat:     Mouth: Mucous membranes are moist.     Pharynx: Oropharynx is clear.  Pulmonary:     Effort: Pulmonary effort is normal.  Skin:    General: Skin is warm and dry.  Neurological:     General: No focal deficit present.     Mental Status: She is alert and oriented to person, place, and time.     Motor: No weakness.      Review of Systems  Cardiovascular:  Negative for chest pain.  Gastrointestinal: Negative.   Genitourinary: Negative.   Neurological:  Negative for dizziness, seizures and headaches.  Blood pressure (!) 138/92, pulse 87, temperature 98.6 F (37 C), temperature source Oral, resp. rate 18, height 5\' 5"  (1.651 m), weight 63 kg, last menstrual period 02/04/2022, SpO2 100 %. Body mass index is 23.13 kg/m.   COGNITIVE FEATURES THAT CONTRIBUTE TO RISK:  Thought constriction (tunnel vision)    SUICIDE RISK:   Moderate:  Frequent suicidal ideation with limited intensity, and duration, some specificity in terms of plans, no associated intent, good self-control, limited dysphoria/symptomatology, some risk factors present, and identifiable protective factors, including available and accessible social support.  PLAN OF CARE:   See H&P for full plan of care  I certify that inpatient services furnished can reasonably be expected to improve the patient's condition.   04/06/2022, MD 02/04/2022, 12:09 PM

## 2022-02-04 NOTE — H&P (Addendum)
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 evaluate symptoms and progress in treatment Patient's case to be discussed in multi-disciplinary team meeting Observation Level : q15 minute checks Vital signs: q12 hours Precautions: suicide, elopement, and assault  2. 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  As patient has trialed SSRI, SNRI, and no longer has full effectiveness from Wellbutrin as monotherapy, will augment with mood stabilizer for continued anxiety and mood  swings.   - Continue home BuSpar 30 mg twice daily for anxiety  PRN: Trazodone 50 mg nightly for insomnia, hydroxyzine 25 mg 3 times daily for anxiety, Tylenol 650 mg every 6 hours for mild pain, and Maalox and Milk of Magnesia for indigestion and constipation  -- The risks/benefits/side-effects/alternatives to this medication were discussed in detail with the patient and time was given for questions. The patient consents to medication trial.              -- Metabolic profile and EKG monitoring obtained while on an atypical antipsychotic  BMI: 23.13 TSH: 0.358 Lipid Panel: Cholesterol 222, TG 237, LDL 121 HbgA1c: 4.4% QTc: 161             -- Encouraged patient to participate in unit milieu and in scheduled group therapies     3. Medical Issues Being Addressed: # History of asthma - Rescue inhaler and Qvar available as needed  #PCOS - Continue home Amethia OCP  #Nicotine use disorder - Nicotine patch 21 mg daily  4. Discharge Planning:              -- Social work and case management to assist with discharge planning and identification of hospital follow-up needs prior to discharge             -- Estimated LOS: 5-7 days             -- Discharge Concerns: Need to establish a safety plan; Medication compliance and effectiveness             --  Discharge Goals: Return home with outpatient referrals for mental health follow-up including medication management/psychotherapy    Lamar Sprinkles, MD 10/7/202311:59 AM

## 2022-02-04 NOTE — Group Note (Signed)
LCSW Group Therapy Note  02/04/2022    10:00-11:00am   Type of Therapy and Topic:  Group Therapy: Early Messages Received About Anger  Participation Level:  Did Not Attend   Description of Group:   In this group, patients shared and discussed the early messages received in their lives about anger through parental or other adult modeling, teaching, repression, punishment, violence, and more.  Participants identified how those childhood lessons influence even now how they usually or often react when angered.  The group discussed that anger is a secondary emotion and what may be the underlying emotional themes that come out through anger outbursts or that are ignored through anger suppression.    Therapeutic Goals: Patients will identify one or more childhood message about anger that they received and how it was taught to them. Patients will discuss how these childhood experiences have influenced and continue to influence their own expression or repression of anger even today. Patients will explore possible primary emotions that tend to fuel their secondary emotion of anger. Patients will learn that anger itself is normal and cannot be eliminated, and that healthier coping skills can assist with resolving conflict rather than worsening situations.  Summary of Patient Progress:  The patient did not attend this group.  Therapeutic Modalities:   Cognitive Behavioral Therapy Motivation Interviewing  [place], [place] 02/04/2022 10:48 AM

## 2022-02-04 NOTE — Progress Notes (Signed)
Patient is on Levonorgestrel-Ethinyl Estradiol (AMETHIA) 0.1-0.02 & 0.01 MG tablet 1 tablet at home and brought it in with her as a home medication. Pharmacist and provider notified.

## 2022-02-04 NOTE — Plan of Care (Signed)
Patient alert, verbal and able to make needs known. Patient reports she is sleeping decent. Her appetite has been poor recently and energy level has been low. She rates her depression 8/10, hopelessness 5/10, anxiety 9/10. Patient denies current SI/HI/AVH. Denies any pain at present. Contracts for safety and took her medication appropriately. Encouraged patient to attend groups and engage in her treatment.     Problem: Education: Goal: Knowledge of [facility] Education information/materials will improve Outcome: Progressing Goal: Verbalization of understanding the information provided will improve Outcome: Progressing   Problem: Activity: Goal: Sleeping patterns will improve Outcome: Progressing   Problem: Coping: Goal: Ability to verbalize frustrations and anger appropriately will improve Outcome: Progressing Goal: Ability to demonstrate self-control will improve Outcome: Progressing

## 2022-02-05 MED ORDER — NICOTINE POLACRILEX 2 MG MT GUM
2.0000 mg | CHEWING_GUM | OROMUCOSAL | Status: DC | PRN
  Administered 2022-02-05 – 2022-02-08 (×2): 2 mg via ORAL
  Filled 2022-02-05: qty 1

## 2022-02-05 MED ORDER — PSEUDOEPHEDRINE HCL 30 MG PO TABS
30.0000 mg | ORAL_TABLET | Freq: Four times a day (QID) | ORAL | Status: DC | PRN
  Administered 2022-02-05: 30 mg via ORAL
  Filled 2022-02-05: qty 1

## 2022-02-05 MED ORDER — HYDROXYZINE HCL 10 MG PO TABS
10.0000 mg | ORAL_TABLET | Freq: Every evening | ORAL | Status: DC | PRN
  Administered 2022-02-05 – 2022-02-07 (×5): 10 mg via ORAL
  Filled 2022-02-05 (×11): qty 1

## 2022-02-05 MED ORDER — FLUTICASONE PROPIONATE 50 MCG/ACT NA SUSP
1.0000 | Freq: Every day | NASAL | Status: DC
  Administered 2022-02-05 – 2022-02-06 (×2): 1 via NASAL
  Filled 2022-02-05 (×2): qty 16

## 2022-02-05 NOTE — [facility] Group Notes (Signed)
Psychoeducational Group Patients were given interactive activity in which they were asked to identify 3 signs they were not compensating well in regards to their mental health. Patients were then asked to recognize three things that fuel them, and help them cope with their mental illness. 

## 2022-02-05 NOTE — Progress Notes (Signed)
Adult Psychoeducational Group Note  Date:  02/05/2022 Time:  9:22 PM  Group Topic/Focus:  Wrap-Up Group:   The focus of this group is to help patients review their daily goal of treatment and discuss progress on daily workbooks.  Participation Level:  Active  Participation Quality:  Appropriate  Affect:  Appropriate  Cognitive:  Appropriate  Insight: Appropriate  Engagement in Group:  Engaged  Modes of Intervention:  Education and Exploration  Additional Comments:  Patient attended and participated in group tonight. She reports that she likes her eyes.  Salley Scarlet [facility] 02/05/2022, 9:22 PM

## 2022-02-05 NOTE — [facility] Group Notes (Signed)
Adult Psychoeducational Group Note Date:  02/05/2022 Time:  1100-1130 Group Topic/Focus: PROGRESSIVE RELAXATION. A group where deep breathing is taught and tensing and relaxation muscle groups is used. Imagery is used as well.  Pts are asked to imagine 3 pillars that hold them up when they are not able to hold themselves up and to share that with the group.   Participation Level:  Active  Participation Quality:  Appropriate  Affect:  Appropriate  Cognitive:  Approprate  Insight: Improving  Engagement in Group:  Engaged  Modes of Intervention:  deep breathing, Imagery. Discussion  Additional Comments:  Rates her energy at a 6/10. Was unable to come up with anything that holds her up.   Participation Level: Kara Lloyd

## 2022-02-05 NOTE — Plan of Care (Signed)
  Problem: Education: Goal: Knowledge of [facility] Education information/materials will improve Outcome: Progressing Goal: Emotional status will improve Outcome: Progressing Goal: Mental status will improve Outcome: Progressing Goal: Verbalization of understanding the information provided will improve Outcome: Progressing   Problem: Activity: Goal: Interest or engagement in activities will improve Outcome: Progressing Goal: Sleeping patterns will improve Outcome: Progressing   Problem: Coping: Goal: Ability to verbalize frustrations and anger appropriately will improve Outcome: Progressing Goal: Ability to demonstrate self-control will improve Outcome: Progressing   

## 2022-02-05 NOTE — [facility] Group Notes (Signed)
Adult Psychoeducational Group  Date:  02/06/2022 Time:  1300-1400  Group Topic/Focus: Continuation of the group from Saturday. Looking at the lists that were created and talking about what needs to be done with the homework of 30 positives about themselves.                                     Talking about taking their power back and helping themselves to develop a positive self esteem.      Participation Quality:  Appropriate  Affect:  Appropriate  Cognitive:  Oriented  Insight: Improving  Engagement in Group:  Engaged  Modes of Intervention:  Activity, Discussion, Education, and Support  Additional Comments:  Pt attended the group and requested to speak after the session was overto go over her positive list with this Probation officer.  Paulino Rily

## 2022-02-05 NOTE — Progress Notes (Addendum)
Pt presents with anxious mood, affect congruent. Pt pleasant and brightens upon approach. [name] reports she slept well, and overall is feeling okay with her mood, hopeful to start new mood stabilizer (trileptal not in pyxis awaiting from pharmacy. ) Patient does report some physical concerns of sinus pressure and ear pressure and states '' I have problems with my eustachian tubes and I always get ear pressure. I've been on flonase to help it before. ''  Pt denies any SI or HI or A/V Hallucinations. Above discussed with MD/treatment team. Pt did complete self inventory, rates her depression at 6/10 on scale, 10 being worst, 0 being none. Pt also rates anxiety at 5/10 and hopelessness at 6 out of 10 , on same scale, with 0 being none, 10 being worst on scale. She does report her goal is to work on [employer] love, and to go to group and talk positively to myself. '' Pt is safe, will con't to monitor.

## 2022-02-05 NOTE — Progress Notes (Signed)
[facility] MD Progress Note  02/05/2022 8:30 AM Kara Lloyd  MRN:  [id number] Subjective:  Kara Lloyd "Kara Lloyd" Shimmel is a 21 year old female with a psychiatric history of MDD-recurrent/severe, self-harm behaviors in the form of cutting, and GAD who presented under IVC from [facility] for suicidal ideation and intrusive self-harm thoughts with a plan to cut herself.   24 Hour Chart Review: Patient has been compliant with all scheduled medications, including Wellbutrin 300 mg, BuSpar 30 mg twice daily, and Trileptal 150 mg twice daily.  She continues to refuse nicotine patch x 48 hours.  She is required hydroxyzine 25 mg x 1 for sleep.  On Assessment today: Patient reports a decrease in intrusive thoughts about self-harm, that they are intermittent but largely have been pushed to the back of her mind. She has no active thoughts about cutting but thinks about the feeling of relief that she used to get from cutting.  She reports an overall improvement to her mood and is smiling much more. She has been participating well in groups.  Patient has been tolerating prescribed medications well mostly without adverse effects; she did report increased daytime somnolence at the current dose of hydroxyzine, but is agreeable to decreased dose. She slept well, and has an improvement in her appetite.  She denies SI, HI, AVH, paranoia, and does not voice delusions. She does endorse sinus pressure as well as nicotine cravings today, which Nicorette gum has helped.  She has no other acute concerns or complaints.   Principal Problem: MDD (major depressive disorder), recurrent episode, severe (HCC) Diagnosis: Principal Problem:   MDD (major depressive disorder), recurrent episode, severe (HCC) Active Problems:   GAD (generalized anxiety disorder)   Total Time spent with patient: 30 minutes  Past Psychiatric History: See H&P  Past Medical History:  Past Medical History:  Diagnosis Date   Ankle injury    Anxiety    Asthma     Depression    Menstrual cramps    Migraine without aura     Past Surgical History:  Procedure Laterality Date   APPENDECTOMY     EVALUATION UNDER ANESTHESIA WITH TEAR DUCT PROBING     LAPAROSCOPIC APPENDECTOMY N/A 12/30/2018   Procedure: APPENDECTOMY LAPAROSCOPIC;  Surgeon: Leonia Corona, MD;  Location: [facility] OR;  Service: Pediatrics;  Laterality: N/A;   Family History:  Family History  Problem Relation Age of Onset   Diabetes Maternal Grandfather    Kidney failure Paternal Grandfather    Family Psychiatric  History: See H&P Social History:  Social History   Substance and Sexual Activity  Alcohol Use Yes   Alcohol/week: 5.0 standard drinks of alcohol   Types: 5 Shots of liquor per week   Comment: Pt states drinks socially on weekends but this past Monday had around 10 shots and blacked out     Social History   Substance and Sexual Activity  Drug Use Never    Social History   Socioeconomic History   Marital status: Single    Spouse name: Not on file   Number of children: Not on file   Years of education: Not on file   Highest education level: Not on file  Occupational History   Not on file  Tobacco Use   Smoking status: Never    Passive exposure: Never   Smokeless tobacco: Never  Vaping Use   Vaping Use: Every day   Substances: Nicotine  Substance and Sexual Activity   Alcohol use: Yes    Alcohol/week: 5.0 standard drinks of  alcohol    Types: 5 Shots of liquor per week    Comment: Pt states drinks socially on weekends but this past Monday had around 10 shots and blacked out   Drug use: Never   Sexual activity: Yes    Birth control/protection: None, Condom, Pill  Other Topics Concern   Not on file  Social History Narrative   Not on file   Social Determinants of Health   Financial Resource Strain: Not on file  Food Insecurity: No Food Insecurity (02/04/2022)   Hunger Vital Sign    Worried About Running Out of Food in the Last Year: Never true    Ran  Out of Food in the Last Year: Never true  Transportation Needs: No Transportation Needs (02/04/2022)   PRAPARE - Administrator, Civil Service (Medical): No    Lack of Transportation (Non-Medical): No  Physical Activity: Not on file  Stress: Not on file  Social Connections: Not on file   Additional Social History:           Sleep: Good  Appetite:  Good  Current Medications: Current Facility-Administered Medications  Medication Dose Route Frequency Provider Last Rate Last Admin   acetaminophen (TYLENOL) tablet 650 mg  650 mg Oral Q6H PRN Bobbitt, Shalon E, NP   650 mg at 02/04/22 1258   albuterol (VENTOLIN HFA) 108 (90 Base) MCG/ACT inhaler 2 puff  2 puff Inhalation Q6H PRN Bobbitt, Shalon E, NP       alum & mag hydroxide-simeth (MAALOX/MYLANTA) 200-200-20 MG/5ML suspension 30 mL  30 mL Oral Q4H PRN Bobbitt, Shalon E, NP       beclomethasone (QVAR) 80 MCG/ACT inhaler 2 puff  2 puff Inhalation BID PRN Bobbitt, Shalon E, NP       buPROPion (WELLBUTRIN XL) 24 hr tablet 300 mg  300 mg Oral Daily Bobbitt, Shalon E, NP   300 mg at 02/05/22 0813   busPIRone (BUSPAR) tablet 30 mg  30 mg Oral BID Bobbitt, Shalon E, NP   30 mg at 02/05/22 0813   hydrOXYzine (ATARAX) tablet 25 mg  25 mg Oral TID PRN Roselyn Bering E, NP   25 mg at 02/04/22 2138   Levonorgestrel-Ethinyl Estradiol (AMETHIA) 0.1-0.02 & 0.01 MG tablet 1 tablet  1 tablet Oral QHS Bobbitt, Shalon E, NP       magnesium hydroxide (MILK OF MAGNESIA) suspension 30 mL  30 mL Oral Daily PRN Bobbitt, Shalon E, NP       nicotine (NICODERM CQ - dosed in mg/24 hours) patch 21 mg  21 mg Transdermal Daily Bobbitt, Shalon E, NP       OXcarbazepine (TRILEPTAL) tablet 150 mg  150 mg Oral Q12H Lamar Sprinkles, MD   150 mg at 02/04/22 1947   traZODone (DESYREL) tablet 50 mg  50 mg Oral QHS PRN Bobbitt, Shalon E, NP        Lab Results:  Results for orders placed or performed during the hospital encounter of 02/03/22 (from the past 48  hour(s))  CBC with Differential/Platelet     Status: None   Collection Time: 02/03/22  1:15 PM  Result Value Ref Range   WBC 6.2 4.0 - 10.5 K/uL   RBC 4.55 3.87 - 5.11 MIL/uL   Hemoglobin 13.6 12.0 - 15.0 g/dL   HCT 63.8 46.6 - 59.9 %   MCV 89.9 80.0 - 100.0 fL   MCH 29.9 26.0 - 34.0 pg   MCHC 33.3 30.0 - 36.0 g/dL  RDW 11.9 11.5 - 15.5 %   Platelets 316 150 - 400 K/uL   nRBC 0.0 0.0 - 0.2 %   Neutrophils Relative % 60 %   Neutro Abs 3.8 1.7 - 7.7 K/uL   Lymphocytes Relative 30 %   Lymphs Abs 1.9 0.7 - 4.0 K/uL   Monocytes Relative 7 %   Monocytes Absolute 0.4 0.1 - 1.0 K/uL   Eosinophils Relative 2 %   Eosinophils Absolute 0.1 0.0 - 0.5 K/uL   Basophils Relative 1 %   Basophils Absolute 0.0 0.0 - 0.1 K/uL   Immature Granulocytes 0 %   Abs Immature Granulocytes 0.01 0.00 - 0.07 K/uL    Comment: Performed at [facility], [address] [place], [place] [address]  Comprehensive metabolic panel     Status: Abnormal   Collection Time: 02/03/22  1:15 PM  Result Value Ref Range   Sodium 138 135 - 145 mmol/L   Potassium 4.1 3.5 - 5.1 mmol/L   Chloride 105 98 - 111 mmol/L   CO2 25 22 - 32 mmol/L   Glucose, Bld 60 (L) 70 - 99 mg/dL    Comment: Glucose reference range applies only to samples taken after fasting for at least 8 hours.   BUN 11 6 - 20 mg/dL   Creatinine, Ser 0.09 0.44 - 1.00 mg/dL   Calcium 9.7 8.9 - 38.1 mg/dL   Total Protein 7.3 6.5 - 8.1 g/dL   Albumin 4.1 3.5 - 5.0 g/dL   AST 14 (L) 15 - 41 U/L   ALT 12 0 - 44 U/L   Alkaline Phosphatase 36 (L) 38 - 126 U/L   Total Bilirubin 0.4 0.3 - 1.2 mg/dL   GFR, Estimated >82 >99 mL/min    Comment: (NOTE) Calculated using the CKD-EPI Creatinine Equation (2021)    Anion gap 8 5 - 15    Comment: Performed at [facility], [address], [place], [place] [address]  Hemoglobin A1c     Status: Abnormal   Collection Time: 02/03/22  1:15 PM  Result Value Ref Range   Hgb A1c MFr Bld 4.4 (L) 4.8 - 5.6 %     Comment: (NOTE) Pre diabetes:          5.7%-6.4%  Diabetes:              >6.4%  Glycemic control for   <7.0% adults with diabetes    Mean Plasma Glucose 79.58 mg/dL    Comment: Performed at [facility], [address] [place], [place] [address]  Ethanol     Status: None   Collection Time: 02/03/22  1:15 PM  Result Value Ref Range   Alcohol, Ethyl (B) <10 <10 mg/dL    Comment: (NOTE) Lowest detectable limit for serum alcohol is 10 mg/dL.  For medical purposes only. Performed at [facility], [address] [place], [place] [address]   Lipid panel     Status: Abnormal   Collection Time: 02/03/22  1:15 PM  Result Value Ref Range   Cholesterol 222 (H) 0 - 200 mg/dL   Triglycerides 510 (H) <150 mg/dL   HDL 54 >25 mg/dL   Total CHOL/HDL Ratio 4.1 RATIO   VLDL 47 (H) 0 - 40 mg/dL   LDL Cholesterol 852 (H) 0 - 99 mg/dL    Comment:        Total Cholesterol/HDL:CHD Risk Coronary Heart Disease Risk Table  Men   Women  1/2 Average Risk   3.4   3.3  Average Risk       5.0   4.4  2 X Average Risk   9.6   7.1  3 X Average Risk  23.4   11.0        Use the calculated Patient Ratio above and the CHD Risk Table to determine the patient's CHD Risk.        ATP III CLASSIFICATION (LDL):  <100     mg/dL   Optimal  100-129  mg/dL   Near or Above                    Optimal  130-159  mg/dL   Borderline  160-189  mg/dL   High  >190     mg/dL   Very High Performed at [facility] [address], [place], [place] [address]   TSH     Status: None   Collection Time: 02/03/22  1:15 PM  Result Value Ref Range   TSH 0.358 0.350 - 4.500 uIU/mL    Comment: Performed by a 3rd Generation assay with a functional sensitivity of <=0.01 uIU/mL. Performed at [facility], [place] [address], [place], [place] [address]   Resp Panel by RT-PCR (Flu A&B, Covid) Anterior Nasal Swab     Status: None   Collection Time: 02/03/22  1:50 PM   Specimen: Anterior Nasal  Swab  Result Value Ref Range   SARS Coronavirus 2 by RT PCR NEGATIVE NEGATIVE    Comment: (NOTE) SARS-CoV-2 target nucleic acids are NOT DETECTED.  The SARS-CoV-2 RNA is generally detectable in upper respiratory specimens during the acute phase of infection. The lowest concentration of SARS-CoV-2 viral copies this assay can detect is 138 copies/mL. A negative result does not preclude SARS-Cov-2 infection and should not be used as the sole basis for treatment or other patient management decisions. A negative result may occur with  improper specimen collection/handling, submission of specimen other than nasopharyngeal swab, presence of viral mutation(s) within the areas targeted by this assay, and inadequate number of viral copies(<138 copies/mL). A negative result must be combined with clinical observations, patient history, and epidemiological information. The expected result is Negative.  Fact Sheet for Patients:  EntrepreneurPulse.com.au  Fact Sheet for Healthcare Providers:  IncredibleEmployment.be  This test is no t yet approved or cleared by the [place] FDA and  has been authorized for detection and/or diagnosis of SARS-CoV-2 by FDA under an Emergency Use Authorization (EUA). This EUA will remain  in effect (meaning this test can be used) for the duration of the COVID-19 declaration under Section 564(b)(1) of the Act, 21 U.S.C.section 360bbb-3(b)(1), unless the authorization is terminated  or revoked sooner.       Influenza A by PCR NEGATIVE NEGATIVE   Influenza B by PCR NEGATIVE NEGATIVE    Comment: (NOTE) The Xpert Xpress SARS-CoV-2/FLU/RSV plus assay is intended as an aid in the diagnosis of influenza from Nasopharyngeal swab specimens and should not be used as a sole basis for treatment. Nasal washings and aspirates are unacceptable for Xpert Xpress SARS-CoV-2/FLU/RSV testing.  Fact Sheet for  Patients: EntrepreneurPulse.com.au  Fact Sheet for Healthcare Providers: IncredibleEmployment.be  This test is not yet approved or cleared by the [place] FDA and has been authorized for detection and/or diagnosis of SARS-CoV-2 by FDA under an Emergency Use Authorization (EUA). This EUA will remain in effect (meaning this test can be  used) for the duration of the COVID-19 declaration under Section 564(b)(1) of the Act, 21 U.S.C. section 360bbb-3(b)(1), unless the authorization is terminated or revoked.  Performed at [facility], [address], [place], [place] [address]   POCT Urine Drug Screen - (I-Screen)     Status: Normal   Collection Time: 02/03/22  2:09 PM  Result Value Ref Range   POC Amphetamine UR None Detected NONE DETECTED (Cut Off Level 1000 ng/mL)   POC Secobarbital (BAR) None Detected NONE DETECTED (Cut Off Level 300 ng/mL)   POC Buprenorphine (BUP) None Detected NONE DETECTED (Cut Off Level 10 ng/mL)   POC Oxazepam (BZO) None Detected NONE DETECTED (Cut Off Level 300 ng/mL)   POC Cocaine UR None Detected NONE DETECTED (Cut Off Level 300 ng/mL)   POC Methamphetamine UR None Detected NONE DETECTED (Cut Off Level 1000 ng/mL)   POC Morphine None Detected NONE DETECTED (Cut Off Level 300 ng/mL)   POC Methadone UR None Detected NONE DETECTED (Cut Off Level 300 ng/mL)   POC Oxycodone UR None Detected NONE DETECTED (Cut Off Level 100 ng/mL)   POC Marijuana UR None Detected NONE DETECTED (Cut Off Level 50 ng/mL)  POC SARS Coronavirus 2 Ag     Status: None   Collection Time: 02/03/22  2:11 PM  Result Value Ref Range   SARSCOV2ONAVIRUS 2 AG NEGATIVE NEGATIVE    Comment: (NOTE) SARS-CoV-2 antigen NOT DETECTED.   Negative results are presumptive.  Negative results do not preclude SARS-CoV-2 infection and should not be used as the sole basis for treatment or other patient management decisions, including infection  control  decisions, particularly in the presence of clinical signs and  symptoms consistent with COVID-19, or in those who have been in contact with the virus.  Negative results must be combined with clinical observations, patient history, and epidemiological information. The expected result is Negative.  Fact Sheet for Patients: https://www.jennings-kim.com/  Fact Sheet for Healthcare Providers: https://alexander-rogers.biz/  This test is not yet approved or cleared by the [place] FDA and  has been authorized for detection and/or diagnosis of SARS-CoV-2 by FDA under an Emergency Use Authorization (EUA).  This EUA will remain in effect (meaning this test can be used) for the duration of  the COV ID-19 declaration under Section 564(b)(1) of the Act, 21 U.S.C. section 360bbb-3(b)(1), unless the authorization is terminated or revoked sooner.    Pregnancy, urine POC     Status: None   Collection Time: 02/03/22  2:11 PM  Result Value Ref Range   Preg Test, Ur NEGATIVE NEGATIVE    Comment:        THE SENSITIVITY OF THIS METHODOLOGY IS >24 mIU/mL     Blood Alcohol level:  Lab Results  Component Value Date   ETH <10 02/03/2022    Metabolic Disorder Labs: Lab Results  Component Value Date   HGBA1C 4.4 (L) 02/03/2022   MPG 79.58 02/03/2022   Lab Results  Component Value Date   PROLACTIN 16.7 04/16/2018   Lab Results  Component Value Date   CHOL 222 (H) 02/03/2022   TRIG 237 (H) 02/03/2022   HDL 54 02/03/2022   CHOLHDL 4.1 02/03/2022   VLDL 47 (H) 02/03/2022   LDLCALC 121 (H) 02/03/2022    Physical Findings:  Musculoskeletal: Strength & Muscle Tone: within normal limits Gait & Station: normal Patient leans: N/A  Psychiatric Specialty Exam:  Presentation  General Appearance:  Appropriate for Environment; Casual   Eye Contact: Fair  Speech: Clear and Coherent; Normal Rate   Speech  Volume: Normal   Handedness: Right    Mood and Affect  Mood: Depressed; Anxious   Affect: Congruent; Depressed    Thought Process  Thought Processes: Coherent; Linear   Descriptions of Associations:Intact   Orientation:Full (Time, Place and Person)   Thought Content:Logical   History of Schizophrenia/Schizoaffective disorder:No   Duration of Psychotic Symptoms:No data recorded  Hallucinations:Hallucinations: None  Ideas of Reference:None   Suicidal Thoughts:Suicidal Thoughts: No  Homicidal Thoughts:Homicidal Thoughts: No   Sensorium  Memory: Immediate Good; Recent Good   Judgment: Fair   Insight: Fair    Art therapist  Concentration: Fair   Attention Span: Fair   Recall: Fair   Fund of Knowledge: Good   Language: Good    Psychomotor Activity  Psychomotor Activity: Psychomotor Activity: Normal   Assets  Assets: Communication Skills; Desire for Improvement; Financial Resources/Insurance; Housing; Leisure Time; Physical Health; Social Support; Transportation; Vocational/Educational    Sleep  Sleep: Sleep: Poor Number of Hours of Sleep: 4.25    Physical Exam: Vitals reviewed.  Constitutional:      General: She is not in acute distress.    Appearance: Normal appearance. She is normal weight. She is not toxic-appearing.  HENT:     Head: Normocephalic and atraumatic.     Mouth/Throat:     Mouth: Mucous membranes are moist.     Pharynx: Oropharynx is clear.  Pulmonary:     Effort: Pulmonary effort is normal.  Skin:    General: Skin is warm and dry.  Neurological:     General: No focal deficit present.     Mental Status: She is alert and oriented to person, place, and time.     Motor: No weakness.      Review of Systems  Cardiovascular:  Negative for chest pain.  Gastrointestinal: Negative.   Genitourinary: Negative.   Neurological:  Negative for dizziness, seizures and headaches. Blood  pressure 106/76, pulse (!) 105, temperature 98.3 F (36.8 C), temperature source Oral, resp. rate 16, height  (1.651 m), weight 63 kg, last menstrual period 02/04/2022, SpO2 100 %. Body mass index is 23.13 kg/m.   Treatment Plan Summary: ASSESSMENT: Principal Problem:   MDD (major depressive disorder), recurrent episode, severe (HCC) Active Problems:   GAD (generalized anxiety disorder)       PLAN: Safety and Monitoring: INVOLUTARILY (By provider at [facility]) admission to inpatient psychiatric unit for safety, stabilization and treatment Daily contact with patient to assess and evaluate symptoms and progress in treatment Patient's case to be discussed in multi-disciplinary team meeting Observation Level : q15 minute checks Vital signs: q12 hours Precautions: suicide, elopement, and assault   2. Psychiatric Diagnoses and Treatment # MDD-recurrent/severe/without psychotic features #Generalized anxiety disorder - Continue home Wellbutrin XL 300 mg daily - Continue Trileptal 150 mg twice daily. Baseline Na 138. As patient has trialed SSRI, SNRI, and no longer has full effectiveness from Wellbutrin as monotherapy, augmenting with mood stabilizer for continued anxiety and mood swings.   - Continue home BuSpar 30 mg twice daily for anxiety -- Start hydroxyzine 10 mg and x 1 PRN insomnia   PRN: Trazodone 50 mg nightly for insomnia, Tylenol 650 mg every 6 hours for mild pain, and Maalox and Milk of Magnesia for indigestion and constipation   -- The risks/benefits/side-effects/alternatives to this medication were discussed in detail with the patient and time was given for questions. The patient consents to medication trial.              --  Metabolic profile and EKG monitoring obtained while on an atypical antipsychotic  BMI: 23.13 TSH: 0.358 Lipid Panel: Cholesterol 222, TG 237, LDL 121 HbgA1c: 4.4% QTc: 811413             -- Encouraged patient to participate in unit milieu and in  scheduled group therapies      3. Medical Issues Being Addressed: #Sinus pressure --Flonase and Sudafed available PRN  # History of asthma - Rescue inhaler and Qvar available as needed   #PCOS - Continue home Amethia OCP   #Nicotine use disorder - Nicotine patch 21 mg daily   #Hyperlipidemia - Discussed lifestyle changes including diet and exercise, and advised PCP follow-up outpatient   4. Discharge Planning:              -- Social work and case management to assist with discharge planning and identification of hospital follow-up needs prior to discharge             -- Estimated LOS: 5-7 days             -- Discharge Concerns: Need to establish a safety plan; Medication compliance and effectiveness             -- Discharge Goals: Return home with outpatient referrals for mental health follow-up including medication management/psychotherapy     Lamar Sprinklesourtney Zannie Runkle, MD 02/05/2022, 8:30 AM
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
## 2022-02-05 NOTE — Tx Team (Signed)
Initial Treatment Plan 02/05/2022 10:51 PM Kara Lloyd AJO:[id number]    PATIENT STRESSORS: Medication change or noncompliance   Substance abuse   Traumatic event     PATIENT STRENGTHS: Active sense of humor  Average or above average intelligence  Communication skills  Motivation for treatment/growth  Supportive family/friends    PATIENT IDENTIFIED PROBLEMS: Depression  Suicidal Ideation        "Get my meds right, learn to love myself"           DISCHARGE CRITERIA:  Improved stabilization in mood, thinking, and/or behavior Need for constant or close observation no longer present Verbal commitment to aftercare and medication compliance  PRELIMINARY DISCHARGE PLAN: Return to previous living arrangement  PATIENT/FAMILY INVOLVEMENT: This treatment plan has been presented to and reviewed with the patient, Kara Lloyd,  The patient and family have been given the opportunity to ask questions and make suggestions.  Margaretann Loveless, RN 02/05/2022, 10:51 PM

## 2022-02-05 NOTE — Progress Notes (Signed)
   02/05/22 2254  Psych Admission Type (Psych Patients Only)  Admission Status Involuntary  Psychosocial Assessment  Patient Complaints None  Eye Contact Fair  Facial Expression Animated  Affect Appropriate to circumstance  Speech Logical/coherent  Interaction Assertive  Motor Activity Other (Comment) (WDL)  Appearance/Hygiene Unremarkable  Behavior Characteristics Appropriate to situation  Mood Pleasant  Thought Process  Coherency WDL  Content WDL  Delusions None reported or observed  Perception WDL  Hallucination None reported or observed  Judgment WDL  Confusion None  Danger to Self  Current suicidal ideation? Denies  Self-Injurious Behavior No self-injurious ideation or behavior indicators observed or expressed   Agreement Not to Harm Self Yes  Description of Agreement verbal  Danger to Others  Danger to Others None reported or observed

## 2022-02-06 ENCOUNTER — Encounter (HOSPITAL_COMMUNITY): Payer: Self-pay

## 2022-02-06 DIAGNOSIS — F332 Major depressive disorder, recurrent severe without psychotic features: Principal | ICD-10-CM

## 2022-02-06 NOTE — Progress Notes (Signed)
Adult Psychoeducational Group Note  Date:  02/06/2022 Time:  9:03 PM  Group Topic/Focus:  AA Group  Participation Level:  Active  Participation Quality:  Appropriate and Attentive  Affect:  Appropriate  Cognitive:  Alert and Appropriate  Insight: Appropriate  Engagement in Group:  Engaged  Modes of Intervention:  Discussion and Support  Additional Comments:   Pt attended and actively participated in the AA Group.  Wetzel Bjornstad Jersi Mcmaster 02/06/2022, 9:03 PM

## 2022-02-06 NOTE — Group Note (Signed)
Occupational Therapy Group Note  Group Topic:Coping Skills  Group Date: 02/06/2022 Start Time: 1400 End Time: 1440 Facilitators: Brantley Stage, OT   Group Description: Group encouraged increased engagement and participation through discussion and activity focused on "Coping Ahead." Patients were split up into teams and selected a card from a stack of positive coping strategies. Patients were instructed to act out/charade the coping skill for other peers to guess and receive points for their team. Discussion followed with a focus on identifying additional positive coping strategies and patients shared how they were going to cope ahead over the weekend while continuing hospitalization stay.  Therapeutic Goal(s): Identify positive vs negative coping strategies. Identify coping skills to be used during hospitalization vs coping skills outside of hospital/at home Increase participation in therapeutic group environment and promote engagement in treatment   Participation Level: Active   Participation Quality: Independent   Behavior: Appropriate   Speech/Thought Process: Coherent   Affect/Mood: Appropriate   Insight: Fair   Judgement: Fair   Individualization: pt was active in their participation of group discussion/activity. New skills were identified  Modes of Intervention: Discussion and Education  Patient Response to Interventions:  Attentive   Plan: Continue to engage patient in OT groups 2 - 3x/week.  02/06/2022  Brantley Stage, OT Cornell Barman, OT

## 2022-02-06 NOTE — Plan of Care (Signed)
Nurse discussed anxiety and depression with patient.  

## 2022-02-06 NOTE — Progress Notes (Addendum)
[facility] MD Progress Note  02/06/2022 7:32 AM Kara Lloyd  MRN:  [id number] Subjective:  Kara Lloyd is a 21 year old female with a psychiatric history of MDD-recurrent/severe, self-harm behaviors in the form of cutting, and GAD who presented under IVC from [facility] for suicidal ideation and intrusive self-harm thoughts with a plan to cut herself.   24 Hour Chart Review: Patient has been compliant with all scheduled medications, including Wellbutrin 300 mg, BuSpar 30 mg twice daily, paroxetine 10 mg nightly (as needed dose not needed), and Trileptal 150 mg twice daily.  Did not require any behavioral PRNs.  On Assessment today: Patient continues to report a decrease in intrusive thoughts about self-harm, particularly since her stressors are not present here in the hospital.  She reports an overall improvement to her mood and is more upbeat. She has been participating well in groups, writing down lessons learned in her journal.  Patient has been tolerating prescribed medications well without adverse effects. She slept well, and has an improvement in her appetite.  She denies SI, HI, AVH, paranoia, and does not voice delusions.  Patient does report some somatic complaints of lightheadedness, headache, and back pain, which she attributes to nicotine withdrawals and uncomfortable bed, respectively.  She has no other acute concerns or complaints.  We discussed discharge planning due to concerns about patient returning to triggering  environment, but she advised that she will be staying home with mom for some time before returning to [facility].  We discussed follow-up appointments, and patient stated that her mom is scheduling her with her own psychiatrist; as well she would like to restart DBT and attend groups.   Principal Problem: MDD (major depressive disorder), recurrent episode, severe (HCC) Diagnosis: Principal Problem:   MDD (major depressive disorder), recurrent episode, severe (HCC) Active  Problems:   GAD (generalized anxiety disorder)   Total Time spent with patient: 30 minutes  Past Psychiatric History: See H&P  Past Medical History:  Past Medical History:  Diagnosis Date   Ankle injury    Anxiety    Asthma    Depression    Menstrual cramps    Migraine without aura     Past Surgical History:  Procedure Laterality Date   APPENDECTOMY     EVALUATION UNDER ANESTHESIA WITH TEAR DUCT PROBING     LAPAROSCOPIC APPENDECTOMY N/A 12/30/2018   Procedure: APPENDECTOMY LAPAROSCOPIC;  Surgeon: Leonia Corona, MD;  Location: [facility] OR;  Service: Pediatrics;  Laterality: N/A;   Family History:  Family History  Problem Relation Age of Onset   Diabetes Maternal Grandfather    Kidney failure Paternal Grandfather    Family Psychiatric  History: See H&P Social History:  Social History   Substance and Sexual Activity  Alcohol Use Yes   Alcohol/week: 5.0 standard drinks of alcohol   Types: 5 Shots of liquor per week   Comment: Pt states drinks socially on weekends but this past Monday had around 10 shots and blacked out     Social History   Substance and Sexual Activity  Drug Use Never    Social History   Socioeconomic History   Marital status: Single    Spouse name: Not on file   Number of children: Not on file   Years of education: Not on file   Highest education level: Not on file  Occupational History   Not on file  Tobacco Use   Smoking status: Never    Passive exposure: Never   Smokeless tobacco: Never  Vaping Use  Vaping Use: Every day   Substances: Nicotine  Substance and Sexual Activity   Alcohol use: Yes    Alcohol/week: 5.0 standard drinks of alcohol    Types: 5 Shots of liquor per week    Comment: Pt states drinks socially on weekends but this past Monday had around 10 shots and blacked out   Drug use: Never   Sexual activity: Yes    Birth control/protection: None, Condom, Pill  Other Topics Concern   Not on file  Social History Narrative    Not on file   Social Determinants of Health   Financial Resource Strain: Not on file  Food Insecurity: No Food Insecurity (02/04/2022)   Hunger Vital Sign    Worried About Running Out of Food in the Last Year: Never true    Ran Out of Food in the Last Year: Never true  Transportation Needs: No Transportation Needs (02/04/2022)   PRAPARE - Administrator, Civil Service (Medical): No    Lack of Transportation (Non-Medical): No  Physical Activity: Not on file  Stress: Not on file  Social Connections: Not on file   Additional Social History:           Sleep: Good  Appetite:  Good  Current Medications: Current Facility-Administered Medications  Medication Dose Route Frequency Provider Last Rate Last Admin   acetaminophen (TYLENOL) tablet 650 mg  650 mg Oral Q6H PRN Bobbitt, Shalon E, NP   650 mg at 02/04/22 1258   albuterol (VENTOLIN HFA) 108 (90 Base) MCG/ACT inhaler 2 puff  2 puff Inhalation Q6H PRN Bobbitt, Shalon E, NP       alum & mag hydroxide-simeth (MAALOX/MYLANTA) 200-200-20 MG/5ML suspension 30 mL  30 mL Oral Q4H PRN Bobbitt, Shalon E, NP       beclomethasone (QVAR) 80 MCG/ACT inhaler 2 puff  2 puff Inhalation BID PRN Bobbitt, Shalon E, NP       buPROPion (WELLBUTRIN XL) 24 hr tablet 300 mg  300 mg Oral Daily Bobbitt, Shalon E, NP   300 mg at 02/05/22 0813   busPIRone (BUSPAR) tablet 30 mg  30 mg Oral BID Bobbitt, Shalon E, NP   30 mg at 02/05/22 1634   fluticasone (FLONASE) 50 MCG/ACT nasal spray 1 spray  1 spray Each Nare Daily Lamar Sprinkles, MD   1 spray at 02/05/22 2127   hydrOXYzine (ATARAX) tablet 10 mg  10 mg Oral QHS,MR X 1 Rosea Dory, MD   10 mg at 02/05/22 2124   Levonorgestrel-Ethinyl Estradiol (AMETHIA) 0.1-0.02 & 0.01 MG tablet 1 tablet  1 tablet Oral QHS Bobbitt, Shalon E, NP   1 tablet at 02/05/22 2120   magnesium hydroxide (MILK OF MAGNESIA) suspension 30 mL  30 mL Oral Daily PRN Bobbitt, Shalon E, NP       nicotine polacrilex  (NICORETTE) gum 2 mg  2 mg Oral PRN Lamar Sprinkles, MD   2 mg at 02/05/22 1507   OXcarbazepine (TRILEPTAL) tablet 150 mg  150 mg Oral Q12H Lamar Sprinkles, MD   150 mg at 02/05/22 2123   pseudoephedrine (SUDAFED) tablet 30 mg  30 mg Oral Q6H PRN Lamar Sprinkles, MD   30 mg at 02/05/22 1634   traZODone (DESYREL) tablet 50 mg  50 mg Oral QHS PRN Bobbitt, Shalon E, NP        Lab Results:  No results found for this or any previous visit (from the past 48 hour(s)).   Blood Alcohol level:  Lab Results  Component Value Date   ETH <10 03/50/0938    Metabolic Disorder Labs: Lab Results  Component Value Date   HGBA1C 4.4 (L) 02/03/2022   MPG 79.58 02/03/2022   Lab Results  Component Value Date   PROLACTIN 16.7 04/16/2018   Lab Results  Component Value Date   CHOL 222 (H) 02/03/2022   TRIG 237 (H) 02/03/2022   HDL 54 02/03/2022   CHOLHDL 4.1 02/03/2022   VLDL 47 (H) 02/03/2022   LDLCALC 121 (H) 02/03/2022    Physical Findings:  Musculoskeletal: Strength & Muscle Tone: within normal limits Gait & Station: normal Patient leans: N/A  Psychiatric Specialty Exam:  Presentation  General Appearance:  Appropriate for Environment; Casual   Eye Contact: Fair   Speech: Clear and Coherent; Normal Rate   Speech Volume: Normal   Handedness: Right    Mood and Affect  Mood: Depressed; Anxious   Affect: Congruent; Depressed    Thought Process  Thought Processes: Coherent; Linear   Descriptions of Associations:Intact   Orientation:Full (Time, Place and Person)   Thought Content:Logical   History of Schizophrenia/Schizoaffective disorder:No   Duration of Psychotic Symptoms:No data recorded  Hallucinations:No data recorded  Ideas of Reference:None   Suicidal Thoughts:No data recorded  Homicidal Thoughts:No data recorded   Sensorium  Memory: Immediate Good; Recent Good   Judgment: Fair   Insight: Fair    Community education officer   Concentration: Fair   Attention Span: Fair   Recall: Desha of Knowledge: Good   Language: Good    Psychomotor Activity  Psychomotor Activity: No data recorded   Assets  Assets: Communication Skills; Desire for Improvement; Financial Resources/Insurance; Housing; Leisure Time; Physical Health; Social Support; Transportation; Vocational/Educational    Sleep  Sleep: No data recorded    Physical Exam: Vitals reviewed.  Constitutional:      General: She is not in acute distress.    Appearance: Normal appearance. She is normal weight. She is not toxic-appearing.  HENT:     Head: Normocephalic and atraumatic.     Mouth/Throat:     Mouth: Mucous membranes are moist.     Pharynx: Oropharynx is clear.  Pulmonary:     Effort: Pulmonary effort is normal.  Skin:    General: Skin is warm and dry.  Neurological:     General: No focal deficit present.     Mental Status: She is alert and oriented to person, place, and time.     Motor: No weakness.      Review of Systems  Cardiovascular:  Negative for chest pain.  Gastrointestinal: Negative.   Genitourinary: Negative.   Neurological:  Negative for dizziness, seizures and headaches. Blood pressure 110/73, pulse (!) 103, temperature 98.4 F (36.9 C), temperature source Oral, resp. rate 16, height 5\' 5"  (1.651 m), weight 63 kg, last menstrual period 02/04/2022, SpO2 100 %. Body mass index is 23.13 kg/m.   Treatment Plan Summary: ASSESSMENT: Principal Problem:   MDD (major depressive disorder), recurrent episode, severe ([place]) Active Problems:   GAD (generalized anxiety disorder)       PLAN: Safety and Monitoring: Voluntary (rescinded IVC today) admission to inpatient psychiatric unit for safety, stabilization and treatment Daily contact with patient to assess and evaluate symptoms and progress in treatment Patient's case to be discussed in multi-disciplinary team meeting Observation Level : q15  minute checks Vital signs: q12 hours Precautions: suicide, elopement, and assault   2. Psychiatric Diagnoses and Treatment # MDD-recurrent/severe/without psychotic features #Generalized anxiety disorder - Continue  home Wellbutrin XL 300 mg daily - Continue Trileptal 150 mg twice daily. Baseline Na 138. As patient has trialed SSRI, SNRI, and no longer has full effectiveness from Wellbutrin as monotherapy, augmenting with mood stabilizer for continued anxiety and mood swings.   Will repeat BMP and obtain 10-hydroxycarbazepine level tomorrow - Continue home BuSpar 30 mg twice daily for anxiety -- Continue hydroxyzine 10 mg and x 1 PRN insomnia   PRN: Trazodone 50 mg nightly for insomnia, Tylenol 650 mg every 6 hours for mild pain, and Maalox and Milk of Magnesia for indigestion and constipation   -- The risks/benefits/side-effects/alternatives to this medication were discussed in detail with the patient and time was given for questions. The patient consents to medication trial.              -- Metabolic profile and EKG monitoring obtained while on an atypical antipsychotic  BMI: 23.13 TSH: 0.358 Lipid Panel: Cholesterol 222, TG 237, LDL 121 HbgA1c: 4.4% QTc: 161             -- Encouraged patient to participate in unit milieu and in scheduled group therapies      3. Medical Issues Being Addressed: #Sinus pressure --Flonase and Sudafed available PRN  # History of asthma - Rescue inhaler and Qvar available as needed   #PCOS - Continue home Amethia OCP   #Nicotine use disorder - Continue Nicorette gum 2 mg as needed   #Hyperlipidemia - Discussed lifestyle changes including diet and exercise, and advised PCP follow-up outpatient   4. Discharge Planning:              -- Social work and case management to assist with discharge planning and identification of hospital follow-up needs prior to discharge             -- Estimated LOS: 2-3 additional days             -- Discharge  Concerns: Need to establish a safety plan; Medication compliance and effectiveness             -- Discharge Goals: Return home with outpatient referrals for mental health follow-up including medication management/psychotherapy     Lamar Sprinkles, MD 02/06/2022, 7:32 AM

## 2022-02-06 NOTE — Progress Notes (Signed)
D:  Patient's self inventory sheet, patient has fair sleep, sleep medication helpful.  Fair appetite, normal energy level, good concentration.  Rated depression and anxiety 5, hopeless 3.  Denied withdrawals.  Denied SI.  Physical problems, lightheaded, headaches.  Physical pain, back pain #6.  Goal is work on Psychologist, occupational.  Plans to attend groups.  No discharge plans. A:  Medications administered per MD orders.  Emotional support and encouragement given. R:  Denied SI and HI, contracts for safety.  Denied A/V hallucinations.  Safety maintained with 15 minute checks.

## 2022-02-06 NOTE — BH IP Treatment Plan (Signed)
Interdisciplinary Treatment and Diagnostic Plan Update  02/06/2022 Time of Session: 1000 Kara Lloyd MRN: [id number]  Principal Diagnosis: MDD (major depressive disorder), recurrent episode, severe (HCC)  Secondary Diagnoses: Principal Problem:   MDD (major depressive disorder), recurrent episode, severe (HCC) Active Problems:   GAD (generalized anxiety disorder)   Current Medications:  Current Facility-Administered Medications  Medication Dose Route Frequency Provider Last Rate Last Admin   acetaminophen (TYLENOL) tablet 650 mg  650 mg Oral Q6H PRN Bobbitt, Shalon E, NP   650 mg at 02/04/22 1258   albuterol (VENTOLIN HFA) 108 (90 Base) MCG/ACT inhaler 2 puff  2 puff Inhalation Q6H PRN Bobbitt, Shalon E, NP       alum & mag hydroxide-simeth (MAALOX/MYLANTA) 200-200-20 MG/5ML suspension 30 mL  30 mL Oral Q4H PRN Bobbitt, Shalon E, NP       beclomethasone (QVAR) 80 MCG/ACT inhaler 2 puff  2 puff Inhalation BID PRN Bobbitt, Shalon E, NP       buPROPion (WELLBUTRIN XL) 24 hr tablet 300 mg  300 mg Oral Daily Bobbitt, Shalon E, NP   300 mg at 02/06/22 0754   busPIRone (BUSPAR) tablet 30 mg  30 mg Oral BID Bobbitt, Shalon E, NP   30 mg at 02/06/22 0753   fluticasone (FLONASE) 50 MCG/ACT nasal spray 1 spray  1 spray Each Nare Daily Lamar Sprinkles, MD   1 spray at 02/06/22 0753   hydrOXYzine (ATARAX) tablet 10 mg  10 mg Oral QHS,MR X 1 Cosby, Courtney, MD   10 mg at 02/05/22 2124   Levonorgestrel-Ethinyl Estradiol (AMETHIA) 0.1-0.02 & 0.01 MG tablet 1 tablet  1 tablet Oral QHS Bobbitt, Shalon E, NP   1 tablet at 02/05/22 2120   magnesium hydroxide (MILK OF MAGNESIA) suspension 30 mL  30 mL Oral Daily PRN Bobbitt, Shalon E, NP       nicotine polacrilex (NICORETTE) gum 2 mg  2 mg Oral PRN Lamar Sprinkles, MD   2 mg at 02/05/22 1507   OXcarbazepine (TRILEPTAL) tablet 150 mg  150 mg Oral Q12H Lamar Sprinkles, MD   150 mg at 02/06/22 0755   pseudoephedrine (SUDAFED) tablet 30 mg  30 mg Oral Q6H  PRN Lamar Sprinkles, MD   30 mg at 02/05/22 1634   traZODone (DESYREL) tablet 50 mg  50 mg Oral QHS PRN Bobbitt, Shalon E, NP       PTA Medications: Medications Prior to Admission  Medication Sig Dispense Refill Last Dose   acetaminophen (TYLENOL) 500 MG tablet Take 1,000 mg by mouth every 6 (six) hours as needed for headache.      albuterol (VENTOLIN HFA) 108 (90 Base) MCG/ACT inhaler Inhale 2 puffs into the lungs every 6 (six) hours as needed for wheezing or shortness of breath.      buPROPion (WELLBUTRIN XL) 300 MG 24 hr tablet TAKE 1 TABLET BY MOUTH EVERY DAY IN THE MORNING (Patient taking differently: 300 mg daily.) 90 tablet 0    busPIRone (BUSPAR) 30 MG tablet Take 1 tablet (30 mg total) by mouth 2 (two) times daily. 180 tablet 0    hydrOXYzine (VISTARIL) 25 MG capsule TAKE 1 CAPSULE BY MOUTH EVERY EVENING AS NEEDED FOR ANXIETY/INSOMNIA (Patient taking differently: 25 mg at bedtime as needed (For anxiety or insomnia).) 90 capsule 2    Levonorgestrel-Ethinyl Estradiol (LOSEASONIQUE) 0.1-0.02 & 0.01 MG tablet Take 1 tablet by mouth daily. (Patient taking differently: Take 1 tablet by mouth at bedtime.) 91 tablet 4    QVAR REDIHALER 80  MCG/ACT inhaler 2 puffs 2 (two) times daily as needed (For shortness of breath).       Patient Stressors: Medication change or noncompliance   Substance abuse   Traumatic event    Patient Strengths: Active sense of humor  Average or above average intelligence  Communication skills  Motivation for treatment/growth  Supportive family/friends   Treatment Modalities: Medication Management, Group therapy, Case management,  1 to 1 session with clinician, Psychoeducation, Recreational therapy.   Physician Treatment Plan for Primary Diagnosis: MDD (major depressive disorder), recurrent episode, severe (HCC) Long Term Goal(s): Improvement in symptoms so as ready for discharge   Short Term Goals: Ability to identify changes in lifestyle to reduce recurrence  of condition will improve Ability to verbalize feelings will improve Ability to disclose and discuss suicidal ideas Ability to demonstrate self-control will improve Ability to identify and develop effective coping behaviors will improve Ability to maintain clinical measurements within normal limits will improve Compliance with prescribed medications will improve Ability to identify triggers associated with substance abuse/mental health issues will improve  Medication Management: Evaluate patient's response, side effects, and tolerance of medication regimen.  Therapeutic Interventions: 1 to 1 sessions, Unit Group sessions and Medication administration.  Evaluation of Outcomes: Progressing  Physician Treatment Plan for Secondary Diagnosis: Principal Problem:   MDD (major depressive disorder), recurrent episode, severe (HCC) Active Problems:   GAD (generalized anxiety disorder)  Long Term Goal(s): Improvement in symptoms so as ready for discharge   Short Term Goals: Ability to identify changes in lifestyle to reduce recurrence of condition will improve Ability to verbalize feelings will improve Ability to disclose and discuss suicidal ideas Ability to demonstrate self-control will improve Ability to identify and develop effective coping behaviors will improve Ability to maintain clinical measurements within normal limits will improve Compliance with prescribed medications will improve Ability to identify triggers associated with substance abuse/mental health issues will improve     Medication Management: Evaluate patient's response, side effects, and tolerance of medication regimen.  Therapeutic Interventions: 1 to 1 sessions, Unit Group sessions and Medication administration.  Evaluation of Outcomes: Progressing   RN Treatment Plan for Primary Diagnosis: MDD (major depressive disorder), recurrent episode, severe (HCC) Long Term Goal(s): Knowledge of disease and therapeutic  regimen to maintain health will improve  Short Term Goals: Ability to remain free from injury will improve, Ability to verbalize frustration and anger appropriately will improve, Ability to demonstrate self-control, Ability to participate in decision making will improve, Ability to verbalize feelings will improve, Ability to disclose and discuss suicidal ideas, Ability to identify and develop effective coping behaviors will improve, and Compliance with prescribed medications will improve  Medication Management: RN will administer medications as ordered by provider, will assess and evaluate patient's response and provide education to patient for prescribed medication. RN will report any adverse and/or side effects to prescribing provider.  Therapeutic Interventions: 1 on 1 counseling sessions, Psychoeducation, Medication administration, Evaluate responses to treatment, Monitor vital signs and CBGs as ordered, Perform/monitor CIWA, COWS, AIMS and Fall Risk screenings as ordered, Perform wound care treatments as ordered.  Evaluation of Outcomes: Progressing   LCSW Treatment Plan for Primary Diagnosis: MDD (major depressive disorder), recurrent episode, severe (HCC) Long Term Goal(s): Safe transition to appropriate next level of care at discharge, Engage patient in therapeutic group addressing interpersonal concerns.  Short Term Goals: Engage patient in aftercare planning with referrals and resources, Increase social support, Increase ability to appropriately verbalize feelings, Increase emotional regulation, Facilitate acceptance of mental  health diagnosis and concerns, Facilitate patient progression through stages of change regarding substance use diagnoses and concerns, Identify triggers associated with mental health/substance abuse issues, and Increase skills for wellness and recovery  Therapeutic Interventions: Assess for all discharge needs, 1 to 1 time with Social worker, Explore available  resources and support systems, Assess for adequacy in community support network, Educate family and significant other(s) on suicide prevention, Complete Psychosocial Assessment, Interpersonal group therapy.  Evaluation of Outcomes: Progressing   Progress in Treatment: Attending groups: Yes. Participating in groups: Yes. Taking medication as prescribed: Yes. Toleration medication: Yes. Family/Significant other contact made: No, will contact:  CSW will obtain consent to reach family/friend.   Patient understands diagnosis: Yes. Discussing patient identified problems/goals with staff: Yes. Medical problems stabilized or resolved: Yes. Denies suicidal/homicidal ideation: No. Issues/concerns per patient self-inventory: Yes. Other: none  New problem(s) identified: No, Describe:  none  New Short Term/Long Term Goal(s): Patient to work towards medication management for mood stabilization; elimination of SI thoughts; development of comprehensive mental wellness plan.  Patient Goals: Patient states their goal for treatment is to "get a diagnosis and correct medication . . . Work on myself and love myself and decrease negative thoughts."  Discharge Plan or Barriers: No psychosocial barriers identified at this time, patient to return to place of residence when appropriate for discharge.   Reason for Continuation of Hospitalization: Depression Suicidal ideation  Estimated Length of Stay: 1-7 days   Last 3 [place] Suicide Severity Risk Score: Eschbach Admission (Current) from 02/03/2022 in [place] [facility] Most recent reading at 02/04/2022 12:25 AM ED from 02/03/2022 in [facility] Most recent reading at 02/03/2022  2:46 PM ED from 02/03/2022 in [facility] Most recent reading at 02/03/2022 11:57 AM  C-SSRS RISK CATEGORY High Risk High Risk Low Risk       Scribe for Treatment Team: Larose Kells 02/06/2022 11:02 AM

## 2022-02-06 NOTE — Progress Notes (Signed)
Gave patient "hot packs" to use per MD request.  Patient signed voluntary admission and consent for treatment form per MD request  on 02-06-2022 at 1800.

## 2022-02-06 NOTE — [facility] Suicide Risk Assessment (Signed)
[place] INPATIENT:  Family/Significant Other Suicide Prevention Education  Suicide Prevention Education:  Education Completed;Jennifer Crombie [phone]( has been identified by the patient as the family member/significant other with whom the patient will be residing, until her return to school.  and identified as the person(s) who will aid the patient in the event of a mental health crisis (suicidal ideations/suicide attempt).  With written consent from the patient, the family member/significant other has been provided the following suicide prevention education, prior to the and/or following the discharge of the patient. Pt will inquire about alternative housing options prior to returning to her off campus housing.  The suicide prevention education provided includes the following: Suicide risk factors Suicide prevention and interventions National Suicide Hotline telephone number [facility] assessment telephone number [facility] Emergency Assistance [place] and/or Residential Mobile Crisis Unit telephone number  Request made of family/significant other to: Remove weapons (e.g., guns, rifles, knives), all items previously/currently identified as safety concern.   Remove drugs/medications (over-the-counter, prescriptions, illicit drugs), all items previously/currently identified as a safety concern.  The family member/significant other verbalizes understanding of the suicide prevention education information provided.  The family member/significant other agrees to remove the items of safety concern listed above.  [place] MSW, LCSW 02/06/2022, 2:28 PM)

## 2022-02-06 NOTE — [facility] Group Notes (Signed)
  Spiritual care group on grief and loss facilitated by chaplain Janne Napoleon, [employer]   Group Goal:   Support / Education around grief and loss   Members engage in facilitated group support and psycho-social education.   Group Description:   Following introductions and group rules, group members engaged in facilitated group dialog and support around topic of loss, with particular support around experiences of loss in their lives. Group Identified types of loss (relationships / self / things) and identified patterns, circumstances, and changes that precipitate losses. Reflected on thoughts / feelings around loss, normalized grief responses, and recognized variety in grief experience. Group noted Worden's four tasks of grief in discussion.   Group drew on Adlerian / Rogerian, narrative, MI,   Patient Progress: Kara Lloyd attended group and actively engaged and participated in the group conversation. She shared about some of her own experiences of grief and demonstrated good insight into the topic.  [address], [place], [phone]

## 2022-02-06 NOTE — Progress Notes (Signed)
Recreation Therapy Notes  Date: 10.9.23 Time: 0930 Location: [address] Dayroom  Group Topic: Stress Management   Goal Area(s) Addresses:  Patient will actively participate in stress management techniques presented during session.  Patient will successfully identify benefit of practicing stress management post d/c.   Intervention: Relaxation exercise with ambient sound and script   Activity: Guided Imagery. LRT provided education, instruction, and demonstration on practice of visualization via guided imagery. Patient was asked to participate in the technique introduced during session. LRT debriefed including topics of mindfulness, stress management and specific scenarios each patient could use these techniques. Patients were given suggestions of ways to access scripts post d/c and encouraged to explore Youtube and other apps available on smartphones, tablets, and computers.  Education:  Stress Management, Discharge Planning.   Education Outcome: Acknowledges education  Clinical Observations/Feedback: Patient did not attend group.    Kara Lloyd, LRT,CTRS        Kara Lloyd 02/06/2022 12:28 PM 

## 2022-02-06 NOTE — Progress Notes (Signed)
Patient wants to talk to MD about her medication Wellbutrin  XL 300 mg. Is there a chance that wellbutrin that it is not releasing throughout the day the way it is supposed to release.  Does not feel that this medicine is working correctly.

## 2022-02-06 NOTE — Group Note (Signed)
LCSW Group Therapy Note  Group Date: 02/06/2022 Start Time: 1300 End Time: 1347   Type of Therapy and Topic:  Group Therapy: Anger Cues and Responses  Participation Level:  Active   Description of Group:   In this group, patients learned how to recognize the physical, cognitive, emotional, and behavioral responses they have to anger-provoking situations.  They identified a recent time they became angry and how they reacted.  They analyzed how their reaction was possibly beneficial and how it was possibly unhelpful.  The group discussed a variety of healthier coping skills that could help with such a situation in the future.  Focus was placed on how helpful it is to recognize the underlying emotions to our anger, because working on those can lead to a more permanent solution as well as our ability to focus on the important rather than the urgent.  Therapeutic Goals: Patients will remember their last incident of anger and how they felt emotionally and physically, what their thoughts were at the time, and how they behaved. Patients will identify how their behavior at that time worked for them, as well as how it worked against them. Patients will explore possible new behaviors to use in future anger situations. Patients will learn that anger itself is normal and cannot be eliminated, and that healthier reactions can assist with resolving conflict rather than worsening situations.  Summary of Patient Progress:  Kara Lloyd was active during the group. she shared a recent occurrence wherein feeling triggered led to anger. she demonstrated good insight into the subject matter, was respectful of peers, and participated throughout the entire session.  Therapeutic Modalities:   Cognitive Behavioral Therapy    Windle Guard, LCSW 02/06/2022  3:58 PM

## 2022-02-07 LAB — BASIC METABOLIC PANEL
Anion gap: 8 (ref 5–15)
BUN: 9 mg/dL (ref 6–20)
CO2: 26 mmol/L (ref 22–32)
Calcium: 9.8 mg/dL (ref 8.9–10.3)
Chloride: 106 mmol/L (ref 98–111)
Creatinine, Ser: 0.79 mg/dL (ref 0.44–1.00)
GFR, Estimated: >60 mL/min (ref >60)
Glucose, Bld: 84 mg/dL (ref 70–99)
Potassium: 3.7 mmol/L (ref 3.5–5.1)
Sodium: 140 mmol/L (ref 135–145)

## 2022-02-07 MED ORDER — FLUTICASONE PROPIONATE 50 MCG/ACT NA SUSP
1.0000 | Freq: Two times a day (BID) | NASAL | Status: DC
  Administered 2022-02-07: 1 via NASAL
  Filled 2022-02-07: qty 16

## 2022-02-07 NOTE — Progress Notes (Signed)
Pt stated she takes the Buspar in the morning and at night and would like the night time dose changed from 5 pm to night time

## 2022-02-07 NOTE — Progress Notes (Signed)
_Pt attended groups today. Pt medication compliant, deneis SI/HI/self harm thoughts. Q15 minute checks ongoing for safety. Pts mother visiting at this time.

## 2022-02-07 NOTE — Progress Notes (Signed)
[facility] MD Progress Note  02/07/2022 7:38 AM Kara Lloyd  MRN:  [id number] Subjective:  Kara Lloyd is a 21 year old female with a psychiatric history of MDD-recurrent/severe, self-harm behaviors in the form of cutting, and GAD who presented under IVC from [facility] for suicidal ideation and intrusive self-harm thoughts with a plan to cut herself.   24 Hour Chart Review: Patient has been compliant with all scheduled medications, including Wellbutrin 300 mg, BuSpar 30 mg twice daily, paroxetine 10 mg nightly (as needed dose not needed), and Trileptal 150 mg twice daily.  Did not require any behavioral PRNs.  On Assessment today: Patient continues to report a decrease in intrusive thoughts about self-harm, particularly since her stressors are not present here in the hospital.  She reports an overall improvement to her mood and is more upbeat. She does report sleep paralysis last night/early morning which proved to be scary for her, but she is not so distressed by the time she is seen. She has been participating well in groups.  Patient has been tolerating prescribed medications well without adverse effects. She has sustained improvements in her appetite.  She denies SI, HI, AVH, paranoia, and does not voice delusions.  Patient denies somatic complaints today.  She has no other acute concerns or complaints.  She feels ready for discharge and determined to figure things out moving forward.   Principal Problem: MDD (major depressive disorder), recurrent episode, severe ([place]) Diagnosis: Principal Problem:   MDD (major depressive disorder), recurrent episode, severe ([facility]) Active Problems:   GAD (generalized anxiety disorder)   Total Time spent with patient: 30 minutes  Past Psychiatric History: See H&P  Past Medical History:  Past Medical History:  Diagnosis Date   Ankle injury    Anxiety    Asthma    Depression    Menstrual cramps    Migraine without aura     Past Surgical History:   Procedure Laterality Date   APPENDECTOMY     EVALUATION UNDER ANESTHESIA WITH TEAR DUCT PROBING     LAPAROSCOPIC APPENDECTOMY N/A 12/30/2018   Procedure: APPENDECTOMY LAPAROSCOPIC;  Surgeon: Gerald Stabs, MD;  Location: [facility];  Service: Pediatrics;  Laterality: N/A;   Family History:  Family History  Problem Relation Age of Onset   Diabetes Maternal Grandfather    Kidney failure Paternal Grandfather    Family Psychiatric  History: See H&P Social History:  Social History   Substance and Sexual Activity  Alcohol Use Yes   Alcohol/week: 5.0 standard drinks of alcohol   Types: 5 Shots of liquor per week   Comment: Pt states drinks socially on weekends but this past Monday had around 10 shots and blacked out     Social History   Substance and Sexual Activity  Drug Use Never    Social History   Socioeconomic History   Marital status: Single    Spouse name: Not on file   Number of children: Not on file   Years of education: Not on file   Highest education level: Not on file  Occupational History   Not on file  Tobacco Use   Smoking status: Never    Passive exposure: Never   Smokeless tobacco: Never  Vaping Use   Vaping Use: Every day   Substances: Nicotine  Substance and Sexual Activity   Alcohol use: Yes    Alcohol/week: 5.0 standard drinks of alcohol    Types: 5 Shots of liquor per week    Comment: Pt states drinks socially on  weekends but this past Monday had around 10 shots and blacked out   Drug use: Never   Sexual activity: Yes    Birth control/protection: None, Condom, Pill  Other Topics Concern   Not on file  Social History Narrative   Not on file   Social Determinants of Health   Financial Resource Strain: Not on file  Food Insecurity: No Food Insecurity (02/04/2022)   Hunger Vital Sign    Worried About Running Out of Food in the Last Year: Never true    Ran Out of Food in the Last Year: Never true  Transportation Needs: No Transportation Needs  (02/04/2022)   PRAPARE - Administrator, Civil Service (Medical): No    Lack of Transportation (Non-Medical): No  Physical Activity: Not on file  Stress: Not on file  Social Connections: Not on file   Additional Social History:           Sleep: Good  Appetite:  Good  Current Medications: Current Facility-Administered Medications  Medication Dose Route Frequency Provider Last Rate Last Admin   acetaminophen (TYLENOL) tablet 650 mg  650 mg Oral Q6H PRN Bobbitt, Shalon E, NP   650 mg at 02/04/22 1258   albuterol (VENTOLIN HFA) 108 (90 Base) MCG/ACT inhaler 2 puff  2 puff Inhalation Q6H PRN Bobbitt, Shalon E, NP       alum & mag hydroxide-simeth (MAALOX/MYLANTA) 200-200-20 MG/5ML suspension 30 mL  30 mL Oral Q4H PRN Bobbitt, Shalon E, NP       beclomethasone (QVAR) 80 MCG/ACT inhaler 2 puff  2 puff Inhalation BID PRN Bobbitt, Shalon E, NP       buPROPion (WELLBUTRIN XL) 24 hr tablet 300 mg  300 mg Oral Daily Bobbitt, Shalon E, NP   300 mg at 02/06/22 0754   busPIRone (BUSPAR) tablet 30 mg  30 mg Oral BID Bobbitt, Shalon E, NP   30 mg at 02/06/22 1613   fluticasone (FLONASE) 50 MCG/ACT nasal spray 1 spray  1 spray Each Nare Daily Lamar Sprinkles, MD   1 spray at 02/06/22 0753   hydrOXYzine (ATARAX) tablet 10 mg  10 mg Oral QHS,MR X 1 Maryiah Olvey, MD   10 mg at 02/06/22 2239   Levonorgestrel-Ethinyl Estradiol (AMETHIA) 0.1-0.02 & 0.01 MG tablet 1 tablet  1 tablet Oral QHS Bobbitt, Shalon E, NP   1 tablet at 02/06/22 2136   magnesium hydroxide (MILK OF MAGNESIA) suspension 30 mL  30 mL Oral Daily PRN Bobbitt, Shalon E, NP       nicotine polacrilex (NICORETTE) gum 2 mg  2 mg Oral PRN Lamar Sprinkles, MD   2 mg at 02/05/22 1507   OXcarbazepine (TRILEPTAL) tablet 150 mg  150 mg Oral Q12H Lamar Sprinkles, MD   150 mg at 02/06/22 2136   pseudoephedrine (SUDAFED) tablet 30 mg  30 mg Oral Q6H PRN Lamar Sprinkles, MD   30 mg at 02/05/22 1634   traZODone (DESYREL) tablet 50 mg  50 mg  Oral QHS PRN Bobbitt, Shalon E, NP        Lab Results:  No results found for this or any previous visit (from the past 48 hour(s)).   Blood Alcohol level:  Lab Results  Component Value Date   ETH <10 02/03/2022    Metabolic Disorder Labs: Lab Results  Component Value Date   HGBA1C 4.4 (L) 02/03/2022   MPG 79.58 02/03/2022   Lab Results  Component Value Date   PROLACTIN 16.7 04/16/2018  Lab Results  Component Value Date   CHOL 222 (H) 02/03/2022   TRIG 237 (H) 02/03/2022   HDL 54 02/03/2022   CHOLHDL 4.1 02/03/2022   VLDL 47 (H) 02/03/2022   LDLCALC 121 (H) 02/03/2022    Physical Findings:  Musculoskeletal: Strength & Muscle Tone: within normal limits Gait & Station: normal Patient leans: N/A  Psychiatric Specialty Exam:  Presentation  General Appearance:  Appropriate for Environment; Casual   Eye Contact: Good   Speech: Clear and Coherent; Normal Rate   Speech Volume: Normal   Handedness: Right    Mood and Affect  Mood: Euthymic   Affect: Appropriate; Congruent    Thought Process  Thought Processes: Coherent; Linear   Descriptions of Associations:Intact   Orientation:Full (Time, Place and Person)   Thought Content:Logical; WDL   History of Schizophrenia/Schizoaffective disorder:No   Duration of Psychotic Symptoms:No data recorded  Hallucinations:Hallucinations: None  Ideas of Reference:None   Suicidal Thoughts:Suicidal Thoughts: No  Homicidal Thoughts:Homicidal Thoughts: No   Sensorium  Memory: Immediate Good; Recent Good   Judgment: Good   Insight: Good    Executive Functions  Concentration: Good   Attention Span: Good   Recall: Good   Fund of Knowledge: Good   Language: Good    Psychomotor Activity  Psychomotor Activity: Psychomotor Activity: Normal   Assets  Assets: Communication Skills; Desire for Improvement; Financial Resources/Insurance; Housing; Leisure Time;  Physical Health; Social Support; Talents/Skills; Transportation; Vocational/Educational    Sleep  Sleep: Sleep: Good    Physical Exam: Vitals reviewed.  Constitutional:      General: She is not in acute distress.    Appearance: Normal appearance. She is normal weight. She is not toxic-appearing.  HENT:     Head: Normocephalic and atraumatic.     Mouth/Throat:     Mouth: Mucous membranes are moist.     Pharynx: Oropharynx is clear.  Pulmonary:     Effort: Pulmonary effort is normal.  Skin:    General: Skin is warm and dry.  Neurological:     General: No focal deficit present.     Mental Status: She is alert and oriented to person, place, and time.     Motor: No weakness.      Review of Systems  Cardiovascular:  Negative for chest pain.  Gastrointestinal: Negative.   Genitourinary: Negative.   Neurological:  Negative for dizziness, seizures and headaches. Blood pressure 98/75, pulse (!) 111, temperature 98 F (36.7 C), temperature source Oral, resp. rate 17, height 5\' 5"  (1.651 m), weight 63 kg, last menstrual period 02/04/2022, SpO2 100 %. Body mass index is 23.13 kg/m.   Treatment Plan Summary: ASSESSMENT: Principal Problem:   MDD (major depressive disorder), recurrent episode, severe ([place]) Active Problems:   GAD (generalized anxiety disorder)       PLAN: Safety and Monitoring: Voluntary (rescinded IVC today) admission to inpatient psychiatric unit for safety, stabilization and treatment Daily contact with patient to assess and evaluate symptoms and progress in treatment Patient's case to be discussed in multi-disciplinary team meeting Observation Level : q15 minute checks Vital signs: q12 hours Precautions: suicide, elopement, and assault   2. Psychiatric Diagnoses and Treatment # MDD-recurrent/severe/without psychotic features #Generalized anxiety disorder - Continue home Wellbutrin XL 300 mg daily - Continue Trileptal 150 mg twice daily. Baseline Na  138; repeat 140. As patient has trialed SSRI, SNRI, and no longer has full effectiveness from Wellbutrin as monotherapy, augmenting with mood stabilizer for continued anxiety and mood swings. Trileptal level pending -  Continue home BuSpar 30 mg twice daily for anxiety -- Continue hydroxyzine 10 mg and x 1 PRN insomnia   PRN: Trazodone 50 mg nightly for insomnia, Tylenol 650 mg every 6 hours for mild pain, and Maalox and Milk of Magnesia for indigestion and constipation   -- The risks/benefits/side-effects/alternatives to this medication were discussed in detail with the patient and time was given for questions. The patient consents to medication trial.              -- Metabolic profile and EKG monitoring obtained while on an atypical antipsychotic  BMI: 23.13 TSH: 0.358 Lipid Panel: Cholesterol 222, TG 237, LDL 121 HbgA1c: 4.4% QTc: 413             -- Encouraged patient to participate in unit milieu and in scheduled group therapies      3. Medical Issues Being Addressed: #Sinus pressure --Flonase and Sudafed available PRN  # History of asthma - Rescue inhaler and Qvar available as needed   #PCOS - Continue home Amethia OCP   #Nicotine use disorder - Continue Nicorette gum 2 mg as needed   #Hyperlipidemia - Discussed lifestyle changes including diet and exercise, and advised PCP follow-up outpatient   4. Discharge Planning:              -- Social work and case management to assist with discharge planning and identification of hospital follow-up needs prior to discharge             -- Estimated Date of Discharge: 10/11             -- Discharge Concerns: Need to establish a safety plan; Medication compliance and effectiveness             -- Discharge Goals: Return home with outpatient referrals for mental health follow-up including medication management/psychotherapy     Rosezetta Schlatter, MD 02/07/2022, 7:38 AM

## 2022-02-07 NOTE — Progress Notes (Signed)
The patient rated her day as a  6 out of 10 due to "frustrations". Her positive event for the day is that she was visited by her mother  and because she worked on puzzles as well.

## 2022-02-07 NOTE — Progress Notes (Addendum)
D- Patient alert and oriented. Denies SI, HI, AVH, and endorses pain 3/10. 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 Patient was observed in the dayroom during group.   A- Scheduled medications administered to patient, per MD orders. Patient required second dose of hydroxyzine stating that the first "wasn't enough." Birth control medication administered at 2136 because patient states that is her preferred time and the time she takes it at home. Support and encouragement provided. Patient was encouraged to perform at home stretching exercises to alleviate pain. Heating pads provided by day shift per MD orders. Routine safety checks conducted every 15 minutes.  Patient informed to notify staff with problems or concerns.  R- No adverse drug reactions noted. Patient contracts for safety at this time. Patient compliant with medications and treatment plan. Pain interventions effective. Patient receptive, calm, and cooperative. Patient interacts well with others on the unit.  Patient remains safe at this time.

## 2022-02-07 NOTE — Plan of Care (Signed)
  Problem: Education: Goal: Emotional status will improve Outcome: Progressing Goal: Mental status will improve Outcome: Progressing   Problem: Activity: Goal: Sleeping patterns will improve Outcome: Progressing   Problem: Education: Goal: Knowledge of the prescribed therapeutic regimen will improve Outcome: Progressing   Problem: Activity: Goal: Imbalance in normal sleep/wake cycle will improve Outcome: Progressing   Problem: Medication: Goal: Compliance with prescribed medication regimen will improve Outcome: Progressing

## 2022-02-07 NOTE — Progress Notes (Signed)
Pt wanted to know the results of her genetic testing for her medications, it looks like the test were done in 2019, pt encouraged to talk to the doctor. Looks like genesight testing was done

## 2022-02-07 NOTE — Progress Notes (Signed)
   02/07/22 0500  Sleep  Number of Hours 6

## 2022-02-07 NOTE — Progress Notes (Signed)
   02/07/22 2000  Psych Admission Type (Psych Patients Only)  Admission Status Voluntary  Psychosocial Assessment  Patient Complaints None  Eye Contact Fair  Facial Expression Animated  Affect Appropriate to circumstance  Speech Logical/coherent  Interaction Assertive  Motor Activity Slow  Appearance/Hygiene Unremarkable  Behavior Characteristics Appropriate to situation  Mood Anxious  Aggressive Behavior  Effect No apparent injury  Thought Process  Coherency WDL  Content WDL  Delusions WDL  Perception WDL  Hallucination None reported or observed  Judgment WDL  Confusion None  Danger to Self  Current suicidal ideation? Denies  Danger to Others  Danger to Others None reported or observed

## 2022-02-08 LAB — 10-HYDROXYCARBAZEPINE: Triliptal/MTB(Oxcarbazepin): 3 ug/mL — ABNORMAL LOW (ref 10–35)

## 2022-02-08 MED ORDER — NICOTINE POLACRILEX 2 MG MT GUM: 2.0000 mg | CHEWING_GUM | OROMUCOSAL | 0 refills | Status: DC | PRN

## 2022-02-08 MED ORDER — OXCARBAZEPINE 150 MG PO TABS: 150.0000 mg | ORAL_TABLET | Freq: Two times a day (BID) | ORAL | 0 refills | Status: DC

## 2022-02-08 MED ORDER — BUSPIRONE HCL 30 MG PO TABS: 30.0000 mg | ORAL_TABLET | Freq: Two times a day (BID) | ORAL | 0 refills | Status: AC

## 2022-02-08 MED ORDER — BUPROPION HCL ER (XL) 300 MG PO TB24: 300.0000 mg | ORAL_TABLET | Freq: Every day | ORAL | 0 refills | Status: AC

## 2022-02-08 NOTE — Progress Notes (Signed)
   02/08/22 0515  Sleep  Number of Hours 5

## 2022-02-08 NOTE — Group Note (Unsigned)
[facility] LCSW Group Therapy Note   Group Date: 02/08/2022 Start Time: 1300 End Time: 1400   Type of Therapy/Topic:  Group Therapy:  Balance in Life  Participation Level:  {[facility] PARTICIPATION LEVEL:22264}   Description of Group:    This group will address the concept of balance and how it feels and looks when one is unbalanced. Patients will be encouraged to process areas in their lives that are out of balance, and identify reasons for remaining unbalanced. Facilitators will guide patients utilizing problem- solving interventions to address and correct the stressor making their life unbalanced. Understanding and applying boundaries will be explored and addressed for obtaining  and maintaining a balanced life. Patients will be encouraged to explore ways to assertively make their unbalanced needs known to significant others in their lives, using other group members and facilitator for support and feedback.  Therapeutic Goals: 1. Patient will identify two or more emotions or situations they have that consume much of in their lives. 2. Patient will identify signs/triggers that life has become out of balance:  3. Patient will identify two ways to set boundaries in order to achieve balance in their lives:  4. Patient will demonstrate ability to communicate their needs through discussion and/or role plays  Summary of Patient Progress:    ***    Therapeutic Modalities:   Cognitive Behavioral Therapy Solution-Focused Therapy Assertiveness Training   Kara Lloyd S Zarin Knupp, LCSW 

## 2022-02-08 NOTE — Progress Notes (Signed)
Order received for pt discharge. AVS printed, and reviewed with patient at length including medications, follow up and crisis services as well as local crisis care. Patient verbalized understanding and opportunity for questions given. Pt denies any SI or HI or A/ V Hallucinations. No signs of acute decompensation. Pt belongings returned and paperwork given , pt escorted from unit to care of family, in lobby.

## 2022-02-08 NOTE — Discharge Summary (Signed)
Physician Discharge Summary Note  Patient:  Kara Lloyd is an 21 y.o., female MRN:  [id number] DOB:  03-12-2001 Patient phone:  [phone] (home)  Patient address:   [place] [address],  Total Time spent with patient: 20 minutes  Date of Admission:  02/03/2022 Date of Discharge: 02/08/2022  Reason for Admission:  Per H&P "Kara Lloyd "Radonna Ricker" Demsky is a 21 year old female with a psychiatric history of MDD-recurrent/severe, self-harm behaviors in the form of cutting, and GAD who presented under IVC from [facility] for suicidal ideation and intrusive self-harm thoughts with a plan to cut herself.   On Chart Review: Patient has not had a prior inpatient psychiatric admission, but was seen outpatient by psychiatrist, Raquel James, MD from 03/20/2017 to 09/18/2019 then by Maudry Mayhew, MD from 10/2019 to 06/2020.  Patient attempted to schedule with Berniece Andreas, MD after Dr. Montel Culver retired, but has not yet been able to see him (no appointment available until May 02, 2022).  PCP prescribes her Wellbutrin, and gynecologist prescribes her BuSpar."  Principal Problem: MDD (major depressive disorder), recurrent episode, severe ([place]) Discharge Diagnoses: Principal Problem:   MDD (major depressive disorder), recurrent episode, severe ([place]) Active Problems:   GAD (generalized anxiety disorder)    Past Psychiatric History:  Previous Psych Diagnoses: MDD, GAD Prior inpatient treatment: None Prior outpatient treatment: See above Psychotherapy hx: Previous DBT History of suicide: No true suicide attempts, but self-harm via cutting.  Patient did have one instance of intrusive suicidal ideation with a plan to overdose on pills, but did not follow through. History of homicide: Denies Psychiatric medication history: Previously trialed Lexapro, hydroxyzine, Effexor Psychiatric medication compliance history: Reported compliance from 2018-2019, then self discontinued Wellbutrin  when not seeing Dr. Melanee Left.  Maintained compliance when regularly visiting.  Although patient has not seen Dr. Melanee Left since 2021, patient has seen other providers and has maintained compliance with Wellbutrin. Neuromodulation history: Denies Current Psychiatrist: Awaiting appointment with Dr. Adele Schilder January 2024 Current therapist: Denies  Past Medical History:  Past Medical History:  Diagnosis Date   Ankle injury    Anxiety    Asthma    Depression    Menstrual cramps    Migraine without aura     Past Surgical History:  Procedure Laterality Date   APPENDECTOMY     EVALUATION UNDER ANESTHESIA WITH TEAR DUCT PROBING     LAPAROSCOPIC APPENDECTOMY N/A 12/30/2018   Procedure: APPENDECTOMY LAPAROSCOPIC;  Surgeon: Gerald Stabs, MD;  Location: [facility];  Service: Pediatrics;  Laterality: N/A;   Family History:  Family History  Problem Relation Age of Onset   Diabetes Maternal Grandfather    Kidney failure Paternal Grandfather    Family Psychiatric  History: Per outpatient report "mother, maternal grandparents, maternal aunts, cousins all with history of depression and/or anxiety; maternal grandfather with alcohol abuse; maternal aunt with drug dependency; father with alcohol abuse, described as narcissistic and controlling; alcohol and other SA in other members of father's family; brother with anxiety; brother with depression" Social History:  Social History   Substance and Sexual Activity  Alcohol Use Yes   Alcohol/week: 5.0 standard drinks of alcohol   Types: 5 Shots of liquor per week   Comment: Pt states drinks socially on weekends but this past Monday had around 10 shots and blacked out     Social History   Substance and Sexual Activity  Drug Use Never    Social History   Socioeconomic History   Marital status: Single  Spouse name: Not on file   Number of children: Not on file   Years of education: Not on file   Highest education level: Not on file  Occupational  History   Not on file  Tobacco Use   Smoking status: Never    Passive exposure: Never   Smokeless tobacco: Never  Vaping Use   Vaping Use: Every day   Substances: Nicotine  Substance and Sexual Activity   Alcohol use: Yes    Alcohol/week: 5.0 standard drinks of alcohol    Types: 5 Shots of liquor per week    Comment: Pt states drinks socially on weekends but this past Monday had around 10 shots and blacked out   Drug use: Never   Sexual activity: Yes    Birth control/protection: None, Condom, Pill  Other Topics Concern   Not on file  Social History Narrative   Not on file   Social Determinants of Health   Financial Resource Strain: Not on file  Food Insecurity: No Food Insecurity (02/04/2022)   Hunger Vital Sign    Worried About Running Out of Food in the Last Year: Never true    Ran Out of Food in the Last Year: Never true  Transportation Needs: No Transportation Needs (02/04/2022)   PRAPARE - Hydrologist (Medical): No    Lack of Transportation (Non-Medical): No  Physical Activity: Not on file  Stress: Not on file  Social Connections: Not on file    Hospital Course:    During the patient's hospitalization, patient had extensive initial psychiatric evaluation, and follow-up psychiatric evaluations every day.  Psychiatric diagnoses provided upon initial assessment:  Principal Problem:   MDD (major depressive disorder), recurrent episode, severe ([place]) Active Problems:   GAD (generalized anxiety disorder)  Patient's psychiatric medications were adjusted on admission:  - Continue home Wellbutrin XL 300 mg daily - Start Trileptal 150 mg twice daily. - Continue home BuSpar 30 mg twice daily for anxiety  During the hospitalization, other adjustments were made to the patient's psychiatric medication regimen:  -- Started hydroxyzine 10 mg qHS and x 1 PRN insomnia  Patient's care was discussed during the interdisciplinary team meeting every day  during the hospitalization.  The patient denied having side effects to prescribed psychiatric medication.  Gradually, patient started adjusting to milieu. The patient was evaluated each day by a clinical provider to ascertain response to treatment. Improvement was noted by the patient's report of decreasing symptoms, improved sleep and appetite, affect, medication tolerance, behavior, and participation in unit programming.  Patient was asked each day to complete a self inventory noting mood, mental status, pain, new symptoms, anxiety and concerns.   Symptoms were reported as significantly decreased or resolved completely by discharge.  The patient reports that their mood is stable.  The patient denied having suicidal thoughts for more than 48 hours prior to discharge.  Patient denies having homicidal thoughts.  Patient denies having auditory hallucinations.  Patient denies any visual hallucinations or other symptoms of psychosis.  The patient was motivated to continue taking medication with a goal of continued improvement in mental health.   The patient reports their target psychiatric symptoms of intrusive self-harm thoughts and depression responded well to the psychiatric medications, and the patient reports overall benefit other psychiatric hospitalization. Supportive psychotherapy was provided to the patient. The patient also participated in regular group therapy while hospitalized. Coping skills, problem solving as well as relaxation therapies were also part of the unit  programming.  Labs were reviewed with the patient, and abnormal results were discussed with the patient.  The patient is able to verbalize their individual safety plan to this provider.  # It is recommended to the patient to continue psychiatric medications as prescribed, after discharge from the hospital.    # It is recommended to the patient to follow up with your outpatient psychiatric provider and PCP.  # It was discussed  with the patient, the impact of alcohol, drugs, tobacco have been there overall psychiatric and medical wellbeing, and total abstinence from substance use was recommended the patient.ed.  # Prescriptions provided or sent directly to preferred pharmacy at discharge. Patient agreeable to plan. Given opportunity to ask questions. Appears to feel comfortable with discharge.    # In the event of worsening symptoms, the patient is instructed to call the crisis hotline, 911 and or go to the nearest ED for appropriate evaluation and treatment of symptoms. To follow-up with primary care provider for other medical issues, concerns and or health care needs  # Patient was discharged home with a plan to follow up as noted below.  Physical Findings:  Musculoskeletal: Strength & Muscle Tone: within normal limits Gait & Station: normal Patient leans: N/A   Psychiatric Specialty Exam:  Presentation  General Appearance:  Appropriate for Environment; Casual   Eye Contact: Good   Speech: Clear and Coherent; Normal Rate   Speech Volume: Normal   Handedness: Right    Mood and Affect  Mood: Euthymic   Affect: Appropriate; Congruent    Thought Process  Thought Processes: Coherent; Linear   Descriptions of Associations:Intact   Orientation:Full (Time, Place and Person)   Thought Content:Logical; WDL   History of Schizophrenia/Schizoaffective disorder:No   Duration of Psychotic Symptoms:No data recorded Hallucinations:Hallucinations: None   Ideas of Reference:None   Suicidal Thoughts:Suicidal Thoughts: No   Homicidal Thoughts:Homicidal Thoughts: No    Sensorium  Memory: Immediate Good; Recent Good   Judgment: Good   Insight: Good    Executive Functions  Concentration: Good   Attention Span: Good   Recall: Good   Fund of Knowledge: Good   Language: Good    Psychomotor Activity  Psychomotor Activity:Psychomotor Activity:  Normal    Assets  Assets: Communication Skills; Desire for Improvement; Financial Resources/Insurance; Housing; Leisure Time; Physical Health; Social Support; Talents/Skills; Transportation; Vocational/Educational    Sleep  Sleep:Sleep: Fair Number of Hours of Sleep: 5     Physical Exam: Vitals reviewed.  Constitutional:      General: She is not in acute distress.    Appearance: Normal appearance. She is normal weight. She is not toxic-appearing.  HENT:     Head: Normocephalic and atraumatic.     Mouth/Throat:     Mouth: Mucous membranes are moist.     Pharynx: Oropharynx is clear.  Pulmonary:     Effort: Pulmonary effort is normal.  Skin:    General: Skin is warm and dry.  Neurological:     General: No focal deficit present.     Mental Status: She is alert and oriented to person, place, and time.     Motor: No weakness.      Review of Systems  Cardiovascular:  Negative for chest pain.  Gastrointestinal: Negative.   Genitourinary: Negative.   Neurological:  Negative for dizziness, seizures and headaches. Blood pressure 121/86, pulse 100, temperature 98 F (36.7 C), temperature source Oral, resp. rate 17, height 5\' 5"  (1.651 m), weight 63 kg, last menstrual period 02/04/2022,  SpO2 99 %. Body mass index is 23.13 kg/m.   Social History   Tobacco Use  Smoking Status Never   Passive exposure: Never  Smokeless Tobacco Never   Tobacco Cessation:  A prescription for an FDA-approved tobacco cessation medication provided at discharge   Blood Alcohol level:  Lab Results  Component Value Date   ETH <10 [id number]    Metabolic Disorder Labs:  Lab Results  Component Value Date   HGBA1C 4.4 (L) 02/03/2022   MPG 79.58 02/03/2022   Lab Results  Component Value Date   PROLACTIN 16.7 04/16/2018   Lab Results  Component Value Date   CHOL 222 (H) 02/03/2022   TRIG 237 (H) 02/03/2022   HDL 54 02/03/2022   CHOLHDL 4.1 02/03/2022   VLDL 47 (H) 02/03/2022    LDLCALC 121 (H) 02/03/2022    See Psychiatric Specialty Exam and Suicide Risk Assessment completed by Attending Physician prior to discharge.  Discharge destination:  Home  Is patient on multiple antipsychotic therapies at discharge:  No   Has Patient had three or more failed trials of antipsychotic monotherapy by history:  No  Recommended Plan for Multiple Antipsychotic Therapies: NA  Discharge Instructions     Diet - low sodium heart healthy   Complete by: As directed    Increase activity slowly   Complete by: As directed       Allergies as of 02/08/2022       Reactions   Advil [ibuprofen] Itching, Rash, Other (See Comments)   Sweating - can take Tylenol   Naproxen Itching, Rash, Other (See Comments)   Sweating - can take Tylenol        Medication List     TAKE these medications      Indication  acetaminophen 500 MG tablet Commonly known as: TYLENOL Take 1,000 mg by mouth every 6 (six) hours as needed for headache.  Indication: Pain   albuterol 108 (90 Base) MCG/ACT inhaler Commonly known as: VENTOLIN HFA Inhale 2 puffs into the lungs every 6 (six) hours as needed for wheezing or shortness of breath.  Indication: Asthma   buPROPion 300 MG 24 hr tablet Commonly known as: WELLBUTRIN XL Take 1 tablet (300 mg total) by mouth daily. What changed:  how much to take how to take this when to take this additional instructions  Indication: Major Depressive Disorder   busPIRone 30 MG tablet Commonly known as: BUSPAR Take 1 tablet (30 mg total) by mouth 2 (two) times daily.  Indication: Anxiety Disorder, Major Depressive Disorder   hydrOXYzine 25 MG capsule Commonly known as: VISTARIL TAKE 1 CAPSULE BY MOUTH EVERY EVENING AS NEEDED FOR ANXIETY/INSOMNIA What changed:  how much to take when to take this reasons to take this additional instructions  Indication: Feeling Anxious   Levonorgestrel-Ethinyl Estradiol 0.1-0.02 & 0.01 MG tablet Commonly known as:  LoSeasonique Take 1 tablet by mouth daily. What changed: when to take this  Indication: Polycystic Ovary Syndrome   nicotine polacrilex 2 MG gum Commonly known as: NICORETTE Take 1 each (2 mg total) by mouth as needed for smoking cessation.  Indication: Nicotine Addiction   OXcarbazepine 150 MG tablet Commonly known as: TRILEPTAL Take 1 tablet (150 mg total) by mouth every 12 (twelve) hours.  Indication: Major depressive disorder augmentation   Qvar RediHaler 80 MCG/ACT inhaler Generic drug: beclomethasone 2 puffs 2 (two) times daily as needed (For shortness of breath).  Indication: Asthma         Follow-up Information  [facility] Call on 02/10/2022.   Why: You are scheduled for a therapy appointment on 02/10/2022 at 1:00pm with [name]. Contact information: [place] [address] [phone]         Chucky May, MD Follow up on 02/22/2022.   Specialty: Psychiatry Why: You are scheduled for a New Patient appointment on 02/22/2022 at 11:15am.  This appointment will be held in-person. Contact information: [place] [address] [phone]                 Follow-up recommendations:   Activity: as tolerated  Diet: heart healthy  Other: -Follow-up with your outpatient psychiatric provider -instructions on appointment date, time, and address (location) are provided to you in discharge paperwork.  -Take your psychiatric medications as prescribed at discharge - instructions are provided to you in the discharge paperwork  -Follow-up with outpatient primary care doctor and other specialists -for management of chronic medical disease, including: Ashthma, allergic rhinitis, PCOS, hyperlipidemia  -Testing: Follow-up with outpatient provider for abnormal lab results: Cholesterol 222, TG 237, LDL 121  -Recommend abstinence from alcohol, tobacco, and other illicit drug use at discharge.   -If your  psychiatric symptoms recur, worsen, or if you have side effects to your psychiatric medications, call your outpatient psychiatric provider, 911, [phone] or go to the nearest emergency department.  -If suicidal thoughts recur, call your outpatient psychiatric provider, 911, [phone] or go to the nearest emergency department.  Signed: Rosezetta Schlatter, MD 02/08/2022, 10:42 AM

## 2022-02-08 NOTE — Progress Notes (Signed)
  [facility] Adult Case Management Discharge Plan :  Will you be returning to the same living situation after discharge:  No. Saggese, jennifer (Mother)  [phone] At discharge, do you have transportation home?: Yes,  Parent Do you have the ability to pay for your medications: Yes,  Insured  Release of information consent forms completed and in the chart;  Patient's signature needed at discharge.  Patient to Follow up at:  Follow-up Information     [facility] Call.   Why: We were unable to reach this provider to schedule an appointment.  Please contact this provider to schedule an appointment as soon as possible after discharge. Contact information: [place] [address] [phone]         Chucky May, MD Follow up on 02/22/2022.   Specialty: Psychiatry Why: You are scheduled for a New Patient appointment on 02/22/2022 at 11:15am.  This appointment will be held in-person. Contact information: [place] [address] [phone]                 Next level of care provider has access to [place] and Suicide Prevention discussed: Yes,  tameah, mihalko (Mother)  [phone]     Has patient been referred to the Quitline?: Patient refused referral  Patient has been referred for addiction treatment: Pt. refused referral Patient to continue working towards treatment goals after discharge. Patient no longer meets criteria for inpatient criteria per attending physician. Continue taking medications as prescribed, nursing to provide instructions at discharge. Follow up with all scheduled appointments.    Lawayne Hartig S Demaurion Dicioccio, LCSW 02/08/2022, 8:47 AM

## 2022-02-08 NOTE — Progress Notes (Signed)
Pt presents with depressed mood, affect congruent. Kara Lloyd reports she is doing '' okay'' just reports '' my period is really bothering me. Right now I'm up walking so it's not that bad, but last night I was hurting and didn't feel like getting up out of bed. '' Patient denies any SI or HI. Explored with patient if she felt ready to discharge, as reports from staff pt may leave today. She states she feels ok to go. Patient compliant with am medications, voices she feels meds have helped her. Visible on the unit, compliant with meals and programming. Pt is in no acute distress at this time. Will con't to monitor.

## 2022-02-08 NOTE — Group Note (Signed)
LCSW Group Therapy Note  Group Date: 02/08/2022 Start Time: 1300 End Time: 1345   Type of Therapy and Topic:  Group Therapy: Anger Cues and Responses  Participation Level:  Active   Description of Group:   In this group, patients learned how to recognize the physical, cognitive, emotional, and behavioral responses they have to anger-provoking situations.  They identified a recent time they became angry and how they reacted.  They analyzed how their reaction was possibly beneficial and how it was possibly unhelpful.  The group discussed a variety of healthier coping skills that could help with such a situation in the future.  Focus was placed on how helpful it is to recognize the underlying emotions to our anger, because working on those can lead to a more permanent solution as well as our ability to focus on the important rather than the urgent.  Therapeutic Goals: Patients will remember their last incident of anger and how they felt emotionally and physically, what their thoughts were at the time, and how they behaved. Patients will identify how their behavior at that time worked for them, as well as how it worked against them. Patients will explore possible new behaviors to use in future anger situations. Patients will learn that anger itself is normal and cannot be eliminated, and that healthier reactions can assist with resolving conflict rather than worsening situations.  Summary of Patient Progress:  Kara Lloyd was active during the group. she shared a recent occurrence wherein feeling triggered led to anger. she demonstrated appropriate insight into the subject matter, was respectful of peers, and participated throughout the entire session.  Therapeutic Modalities:   Cognitive Behavioral Therapy    Windle Guard, LCSW 02/08/2022  3:53 PM

## 2022-02-08 NOTE — Progress Notes (Signed)
Pt. Engaged in 1-1 discussion with nurse to discuss interpersonal concerns and desire to postpone discharge so that she can attend groups.  Discussed patient's identified inventory and preparation for discharge. Provided with education regarding co-dependency and encouraged to increase self-reliance, rather than dependence on others for approval. Physician notified of patient's desire to attend groups.  Agrees that patient may remain on unit until 7pm.  Pt. Notified.  Continue to monitor.

## 2022-02-08 NOTE — Group Note (Unsigned)
LCSW Group Therapy Note   Group Date: 02/08/2022 Start Time: 1300 End Time: 1400   Type of Therapy and Topic:  Group Therapy:   Participation Level:  {[facility] PARTICIPATION LEVEL:22264}  Description of Group:   Therapeutic Goals:  1.     Summary of Patient Progress:    ***  Therapeutic Modalities:   Kaylin Schellenberg S Renisha Cockrum, LCSWA 02/08/2022  3:43 PM    

## 2022-02-08 NOTE — [facility] Group Notes (Signed)
PsychoEducational Group. Patients were given education on the power of positive re-framing. Patients were given interactive exercise in which they were asked to use positive reframing in ordinary situations.  Patients were then read poem of the power of thoughts by the Dali Lama and asked to reflect.  Pt attended and was appropriate. 
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
## 2022-02-08 NOTE — BHH Group Notes (Signed)
Adult Psychoeducational Group Note  Date:  02/08/2022 Time:  10:14 AM  Group Topic/Focus:  Goals Group:   The focus of this group is to help patients establish daily goals to achieve during treatment and discuss how the patient can incorporate goal setting into their daily lives to aide in recovery.  Participation Level:  Active  Participation Quality:  Appropriate  Affect:  Appropriate  Cognitive:  Appropriate  Insight: Appropriate  Engagement in Group:  Engaged  Modes of Intervention:  Education  Kern Reap 02/08/2022, 10:14 AM

## 2022-02-08 NOTE — BHH Suicide Risk Assessment (Signed)
[place] INPATIENT:  Family/Significant Other Suicide Prevention Education  Suicide Prevention Education:  Education Completed; 02-05-2022 dunya, meiners (Mother)  [phone] has been identified by the patient as the family member/significant other with whom the patient will be residing, and identified as the person(s) who will aid the patient in the event of a mental health crisis (suicidal ideations/suicide attempt).  With written consent from the patient, the family member/significant other has been provided the following suicide prevention education, prior to the and/or following the discharge of the patient.  The suicide prevention education provided includes the following: Suicide risk factors Suicide prevention and interventions National Suicide Hotline telephone number [facility] assessment telephone number [facility] Emergency Assistance [place] and/or Residential Mobile Crisis Unit telephone number  Request made of family/significant other to: Remove weapons (e.g., guns, rifles, knives), all items previously/currently identified as safety concern.   Remove drugs/medications (over-the-counter, prescriptions, illicit drugs), all items previously/currently identified as a safety concern.  The family member/significant other verbalizes understanding of the suicide prevention education information provided.  The family member/significant other agrees to remove the items of safety concern listed above.   [place] MSW, LCSW 02/08/2022, 10:41 AM

## 2022-02-09 ENCOUNTER — Telehealth (HOSPITAL_COMMUNITY): Payer: Self-pay | Admitting: Licensed Clinical Social Worker

## 2022-02-09 ENCOUNTER — Telehealth (HOSPITAL_COMMUNITY): Payer: Self-pay | Admitting: Psychiatry

## 2022-02-12 ENCOUNTER — Encounter: Payer: Self-pay | Admitting: Obstetrics and Gynecology

## 2022-02-13 NOTE — Telephone Encounter (Signed)
No response from [facility] but looks like on 02/08/2022--pt received refill by another provider along with other Rxs.Marland Kitchen

## 2022-02-24 ENCOUNTER — Other Ambulatory Visit: Payer: Self-pay

## 2022-02-24 DIAGNOSIS — Z3041 Encounter for surveillance of contraceptive pills: Secondary | ICD-10-CM

## 2022-02-24 MED ORDER — DROSPIRENONE-ETHINYL ESTRADIOL 3-0.02 MG PO TABS: 1.0000 | ORAL_TABLET | Freq: Every day | ORAL | 2 refills | Status: DC

## 2022-02-24 NOTE — Telephone Encounter (Signed)
JJ pt. Pt calling to report that her cramping is worse now that her BCPs changed in 10/2021 from [facility] to [facility].   Pt requesting to switch back to [facility]. I asked pt if she recalled that she needed it to be "brand only." And she replied that she thinks so.  Please advise.  AEX-10/2021.

## 2022-03-29 ENCOUNTER — Other Ambulatory Visit: Payer: Self-pay | Admitting: Obstetrics and Gynecology

## 2022-04-27 ENCOUNTER — Other Ambulatory Visit: Payer: Self-pay

## 2022-04-27 ENCOUNTER — Emergency Department (HOSPITAL_BASED_OUTPATIENT_CLINIC_OR_DEPARTMENT_OTHER): Payer: BC Managed Care – PPO

## 2022-04-27 ENCOUNTER — Emergency Department (HOSPITAL_BASED_OUTPATIENT_CLINIC_OR_DEPARTMENT_OTHER)
Admission: EM | Admit: 2022-04-27 | Discharge: 2022-04-28 | Disposition: A | Discharge status: Home / Self Care | Payer: BC Managed Care – PPO | Attending: Emergency Medicine | Admitting: Emergency Medicine

## 2022-04-27 ENCOUNTER — Encounter (HOSPITAL_BASED_OUTPATIENT_CLINIC_OR_DEPARTMENT_OTHER): Payer: Self-pay | Admitting: Emergency Medicine

## 2022-04-27 DIAGNOSIS — J45909 Unspecified asthma, uncomplicated: Secondary | ICD-10-CM | POA: Insufficient documentation

## 2022-04-27 DIAGNOSIS — R110 Nausea: Secondary | ICD-10-CM | POA: Insufficient documentation

## 2022-04-27 DIAGNOSIS — R102 Pelvic and perineal pain: Secondary | ICD-10-CM | POA: Insufficient documentation

## 2022-04-27 DIAGNOSIS — R1032 Left lower quadrant pain: Secondary | ICD-10-CM | POA: Diagnosis present

## 2022-04-27 LAB — BASIC METABOLIC PANEL
Anion gap: 12 (ref 5–15)
BUN: 14 mg/dL (ref 6–20)
CO2: 23 mmol/L (ref 22–32)
Calcium: 9.8 mg/dL (ref 8.9–10.3)
Chloride: 101 mmol/L (ref 98–111)
Creatinine, Ser: 0.83 mg/dL (ref 0.44–1.00)
GFR, Estimated: >60 mL/min (ref >60)
Glucose, Bld: 99 mg/dL (ref 70–99)
Potassium: 3.9 mmol/L (ref 3.5–5.1)
Sodium: 136 mmol/L (ref 135–145)

## 2022-04-27 LAB — CBC
HCT: 41.7 % (ref 36.0–46.0)
Hemoglobin: 13.8 g/dL (ref 12.0–15.0)
MCH: 29.1 pg (ref 26.0–34.0)
MCHC: 33.1 g/dL (ref 30.0–36.0)
MCV: 87.8 fL (ref 80.0–100.0)
Platelets: 301 10*3/uL (ref 150–400)
RBC: 4.75 MIL/uL (ref 3.87–5.11)
RDW: 11.6 % (ref 11.5–15.5)
WBC: 9.8 10*3/uL (ref 4.0–10.5)
nRBC: 0 % (ref 0.0–0.2)

## 2022-04-27 LAB — HEPATIC FUNCTION PANEL
ALT: 7 U/L (ref 0–44)
AST: 11 U/L — ABNORMAL LOW (ref 15–41)
Albumin: 4.8 g/dL (ref 3.5–5.0)
Alkaline Phosphatase: 40 U/L (ref 38–126)
Bilirubin, Direct: 0.2 mg/dL (ref 0.0–0.2)
Indirect Bilirubin: 0.5 mg/dL (ref 0.3–0.9)
Total Bilirubin: 0.7 mg/dL (ref 0.3–1.2)
Total Protein: 7.9 g/dL (ref 6.5–8.1)

## 2022-04-27 LAB — LIPASE, BLOOD: Lipase: 35 U/L (ref 11–51)

## 2022-04-27 LAB — URINALYSIS, ROUTINE W REFLEX MICROSCOPIC
Bilirubin Urine: NEGATIVE
Glucose, UA: NEGATIVE mg/dL
Hgb urine dipstick: NEGATIVE
Ketones, ur: 15 mg/dL — AB
Leukocytes,Ua: NEGATIVE
Nitrite: NEGATIVE
Protein, ur: NEGATIVE mg/dL
Specific Gravity, Urine: >1.046 — ABNORMAL HIGH (ref 1.005–1.030)
pH: 6 (ref 5.0–8.0)

## 2022-04-27 LAB — PREGNANCY, URINE: Preg Test, Ur: NEGATIVE

## 2022-04-27 MED ORDER — ONDANSETRON 4 MG PO TBDP: ORAL_TABLET | ORAL | 0 refills | Status: DC

## 2022-04-27 MED ORDER — IOHEXOL 300 MG/ML  SOLN
100.0000 mL | Freq: Once | INTRAMUSCULAR | Status: AC | PRN
  Administered 2022-04-27: 75 mL via INTRAVENOUS

## 2022-04-27 MED ORDER — ONDANSETRON HCL 4 MG/2ML IJ SOLN
4.0000 mg | Freq: Once | INTRAMUSCULAR | Status: DC
  Filled 2022-04-27: qty 2

## 2022-04-27 MED ORDER — ACETAMINOPHEN 325 MG PO TABS
650.0000 mg | ORAL_TABLET | Freq: Once | ORAL | Status: AC
  Administered 2022-04-27: 650 mg via ORAL
  Filled 2022-04-27: qty 2

## 2022-04-27 MED ORDER — SODIUM CHLORIDE 0.9 % IV BOLUS
1000.0000 mL | Freq: Once | INTRAVENOUS | Status: AC
  Administered 2022-04-27: 1000 mL via INTRAVENOUS

## 2022-04-27 MED ORDER — DICYCLOMINE HCL 20 MG PO TABS: 20.0000 mg | ORAL_TABLET | Freq: Two times a day (BID) | ORAL | 0 refills | Status: DC

## 2022-04-27 NOTE — Discharge Instructions (Signed)
Please return for worsening pain fever inability eat or drink.  Take 4 over the counter ibuprofen tablets 3 times a day or 2 over-the-counter naproxen tablets twice a day for pain. Also take tylenol 1000mg (2 extra strength) four times a day.

## 2022-04-27 NOTE — ED Triage Notes (Signed)
Left lower side abdominal pain. Sharp pains and dull. Worse today Hx of pcos and concerned for cyst or rupture

## 2022-04-27 NOTE — ED Provider Notes (Signed)
[facility] EMERGENCY DEPT Provider Note   CSN: [id number] Arrival date & time: 04/27/22  1626     History  Chief Complaint  Patient presents with   Abdominal Pain    Kara Lloyd is a 21 y.o. female with a past medical history significant for anxiety, depression, asthma, and PCOS who presents to the ED due to left lower quadrant abdominal/pelvic pain that has been intermittent for the past week.  Patient states pain worsened today.  Just finished her menstrual cycle. Pain associated with nausea.  No vomiting.  No diarrhea.  Denies vaginal discharge or urinary symptoms.  Patient is currently sexually active.  No concern for STIs.  Previous appendectomy.  No other abdominal surgeries.  History obtained from patient and past medical records. No interpreter used during encounter.       Home Medications Prior to Admission medications   Medication Sig Start Date End Date Taking? Authorizing Provider  acetaminophen (TYLENOL) 500 MG tablet Take 1,000 mg by mouth every 6 (six) hours as needed for headache.    [provider]  albuterol (VENTOLIN HFA) 108 (90 Base) MCG/ACT inhaler Inhale 2 puffs into the lungs every 6 (six) hours as needed for wheezing or shortness of breath.    [provider]  buPROPion (WELLBUTRIN XL) 300 MG 24 hr tablet Take 1 tablet (300 mg total) by mouth daily. 02/08/22 03/10/22  Massengill, Harrold Donath, MD  drospirenone-ethinyl estradiol (YAZ) 3-0.02 MG tablet Take 1 tablet by mouth daily. 02/24/22   Chrzanowski, Jami B, NP  hydrOXYzine (VISTARIL) 25 MG capsule TAKE 1 CAPSULE BY MOUTH EVERY EVENING AS NEEDED FOR ANXIETY/INSOMNIA Patient taking differently: 25 mg at bedtime as needed (For anxiety or insomnia). 08/07/19   Gentry Fitz, MD  nicotine polacrilex (NICORETTE) 2 MG gum Take 1 each (2 mg total) by mouth as needed for smoking cessation. 02/08/22   Massengill, Harrold Donath, MD  OXcarbazepine (TRILEPTAL) 150 MG tablet Take 1 tablet (150 mg  total) by mouth every 12 (twelve) hours. 02/08/22 03/10/22  Massengill, Harrold Donath, MD  QVAR REDIHALER 80 MCG/ACT inhaler 2 puffs 2 (two) times daily as needed (For shortness of breath). 09/28/20   [provider]      Allergies    Advil [ibuprofen] and Naproxen    Review of Systems   Review of Systems  Constitutional:  Negative for chills and fever.  Respiratory:  Negative for shortness of breath.   Cardiovascular:  Negative for chest pain.  Gastrointestinal:  Positive for abdominal pain and nausea. Negative for diarrhea and vomiting.  Genitourinary:  Positive for pelvic pain. Negative for dysuria and vaginal discharge.  All other systems reviewed and are negative.   Physical Exam Updated Vital Signs BP 119/84   Pulse (!) 109   Temp 98.3 F (36.8 C)   Resp 18   LMP 04/20/2022   SpO2 100%  Physical Exam Vitals and nursing note reviewed.  Constitutional:      General: She is not in acute distress.    Appearance: She is not ill-appearing.  HENT:     Head: Normocephalic.  Eyes:     Pupils: Pupils are equal, round, and reactive to light.  Cardiovascular:     Rate and Rhythm: Normal rate and regular rhythm.     Pulses: Normal pulses.     Heart sounds: Normal heart sounds. No murmur heard.    No friction rub. No gallop.  Pulmonary:     Effort: Pulmonary effort is normal.     Breath  sounds: Normal breath sounds.  Abdominal:     General: Abdomen is flat. There is no distension.     Palpations: Abdomen is soft.     Tenderness: There is no abdominal tenderness. There is no guarding or rebound.     Comments: Mild TTP in left pelvic region. No rebound or guarding  Musculoskeletal:        General: Normal range of motion.     Cervical back: Neck supple.  Skin:    General: Skin is warm and dry.  Neurological:     General: No focal deficit present.     Mental Status: She is alert.  Psychiatric:        Mood and Affect: Mood normal.        Behavior: Behavior normal.      ED Results / Procedures / Treatments   Labs (all labs ordered are listed, but only abnormal results are displayed) Labs Reviewed  HEPATIC FUNCTION PANEL - Abnormal; Notable for the following components:      Result Value   AST 11 (*)    All other components within normal limits  PREGNANCY, URINE  BASIC METABOLIC PANEL  CBC  LIPASE, BLOOD  URINALYSIS, ROUTINE W REFLEX MICROSCOPIC    EKG None  Radiology US Pelvis Complete  Result Date: 04/27/2022 CLINICAL DATA:  Left lower quadrant pain. History of PCOS ruptured cyst EXAM: TRANSABDOMINAL ULTRASOUND OF PELVIS DOPPLER ULTRASOUND OF OVARIES TECHNIQUE: Transabdominal ultrasound examination of the pelvis was performed including evaluation of the uterus, ovaries, adnexal regions, and pelvic cul-de-sac. Color and duplex Doppler ultrasound was utilized to evaluate blood flow to the ovaries. COMPARISON:  Ultrasound 02/12/2020 FINDINGS: Uterus Measurements: 6.5 x 2.4 x 3.4 cm = volume: 28 mL. Anteverted uterus. No fibroids or other mass. Endometrium Thickness: 5.0 mm.  No focal abnormality visualized. Right ovary Measurements: 2.7 x 1.7 x 2.2 cm = volume: 5.2 mL. Normal appearance/no adnexal mass. Left ovary Measurements: 2.5 x 1.3 x 2.2 cm = volume: 3.7 mL. Normal appearance/no adnexal mass. Pulsed Doppler evaluation demonstrates normal low-resistance arterial and venous waveforms in both ovaries. Other: No free fluid. IMPRESSION: Unremarkable transabdominal pelvic ultrasound. Electronically Signed   By: Minerva Fester M.D.   On: 04/27/2022 21:48   [place] Art/Ven Flow Abd Pelv Doppler  Result Date: 04/27/2022 CLINICAL DATA:  Left lower quadrant pain. History of PCOS ruptured cyst EXAM: TRANSABDOMINAL ULTRASOUND OF PELVIS DOPPLER ULTRASOUND OF OVARIES TECHNIQUE: Transabdominal ultrasound examination of the pelvis was performed including evaluation of the uterus, ovaries, adnexal regions, and pelvic cul-de-sac. Color and duplex Doppler ultrasound  was utilized to evaluate blood flow to the ovaries. COMPARISON:  Ultrasound 02/12/2020 FINDINGS: Uterus Measurements: 6.5 x 2.4 x 3.4 cm = volume: 28 mL. Anteverted uterus. No fibroids or other mass. Endometrium Thickness: 5.0 mm.  No focal abnormality visualized. Right ovary Measurements: 2.7 x 1.7 x 2.2 cm = volume: 5.2 mL. Normal appearance/no adnexal mass. Left ovary Measurements: 2.5 x 1.3 x 2.2 cm = volume: 3.7 mL. Normal appearance/no adnexal mass. Pulsed Doppler evaluation demonstrates normal low-resistance arterial and venous waveforms in both ovaries. Other: No free fluid. IMPRESSION: Unremarkable transabdominal pelvic ultrasound. Electronically Signed   By: Minerva Fester M.D.   On: 04/27/2022 21:48    Procedures Procedures    Medications Ordered in ED Medications  ondansetron [facility]) injection 4 mg (4 mg Intravenous Patient Refused/Not Given 04/27/22 2102)  sodium chloride 0.9 % bolus 1,000 mL (has no administration in time range)  acetaminophen (TYLENOL) tablet 650  mg (has no administration in time range)    ED Course/ Medical Decision Making/ A&P                           Medical Decision Making Amount and/or Complexity of Data Reviewed Independent Historian: parent    Details: Father at bedside External Data Reviewed: notes.    Details: OBGYN notes Labs: ordered. Decision-making details documented in ED Course. Radiology: ordered and independent interpretation performed. Decision-making details documented in ED Course.  Risk OTC drugs. Prescription drug management.   This patient presents to the ED for concern of LLQ abdominal pain, this involves an extensive number of treatment options, and is a complaint that carries with it a high risk of complications and morbidity.  The differential diagnosis includes kidney stone, ovarian cyst, ovarian torsion, diverticulitis, etc  21 year old female presents to the ED due to left lower quadrant abdominal/pelvic pain x 1 week  which worsened today with nausea.  History of PCOS.  Previous appendectomy.  Upon arrival, patient tachycardic at 117 with otherwise reassuring vitals.  Patient in no acute distress.  Mild tenderness throughout left lower quadrant/pelvic region.  Abdominal labs ordered.  Ultrasound to rule out ovarian torsion vs cyst. Zofran given.  CBC unremarkable.  No leukocytosis.  Normal hemoglobin.  Pregnancy test negative.  Doubt ectopic pregnancy.  BMP unremarkable.  Normal renal function.  No major electrolyte derangements.  Lipase normal at 35.  Doubt pancreatitis. [place] negative for cyst or torsion.  9:58 PM reassessed patient at bedside.  Patient admits to significant pain and is requesting Tylenol.  Shared decision-making in regards to CT abdomen to rule out evidence of kidney stone versus other etiologies of pain.  Patient agreeable to CT abdomen.  IV fluids started due to tachycardia however, feel tachycardia is likely due to pain.  Patient handed off to Dr. Adela Lank pending CT abdomen and UA.       Final Clinical Impression(s) / ED Diagnoses Final diagnoses:  Pelvic pain  LLQ abdominal pain    Rx / DC Orders ED Discharge Orders     None         Jesusita Oka 04/27/22 2201    Melene Plan, DO 04/27/22 2234    Melene Plan, DO 04/27/22 2340

## 2022-04-28 NOTE — ED Notes (Signed)
DC papers reviewed. No questions or concerns. No signs of distress. Pt assisted to wheelchair and out to lobby. Appropriate measures for safety taken. 

## 2022-09-03 ENCOUNTER — Other Ambulatory Visit: Payer: Self-pay | Admitting: Radiology

## 2022-09-03 DIAGNOSIS — Z3041 Encounter for surveillance of contraceptive pills: Secondary | ICD-10-CM

## 2022-09-04 NOTE — Telephone Encounter (Signed)
JJ pt.  Last AEX 11/16/2021--recall sent for 2024 per EMR.  Note to pharmacy for pt to schedule AEX for further refills.  Rx pend.

## 2022-09-06 NOTE — Telephone Encounter (Signed)
Pt left VM in triage line stating that she is now scheduled for her AEX on 11/28/2022 and needs a refill on her BCPs. Rx pend.

## 2022-11-28 ENCOUNTER — Encounter: Payer: Self-pay | Admitting: Radiology

## 2022-11-28 ENCOUNTER — Other Ambulatory Visit (HOSPITAL_COMMUNITY)
Admission: RE | Admit: 2022-11-28 | Discharge: 2022-11-28 | Disposition: A | Discharge status: Home / Self Care | Payer: BC Managed Care – PPO | Source: Ambulatory Visit | Attending: Radiology | Admitting: Radiology

## 2022-11-28 ENCOUNTER — Ambulatory Visit (INDEPENDENT_AMBULATORY_CARE_PROVIDER_SITE_OTHER): Payer: BC Managed Care – PPO | Admitting: Radiology

## 2022-11-28 VITALS — BP 118/82 | Ht 65.0 in | Wt 193.0 lb

## 2022-11-28 DIAGNOSIS — Z113 Encounter for screening for infections with a predominantly sexual mode of transmission: Secondary | ICD-10-CM

## 2022-11-28 DIAGNOSIS — Z01419 Encounter for gynecological examination (general) (routine) without abnormal findings: Secondary | ICD-10-CM | POA: Diagnosis not present

## 2022-11-28 DIAGNOSIS — Z3041 Encounter for surveillance of contraceptive pills: Secondary | ICD-10-CM

## 2022-11-28 MED ORDER — DROSPIRENONE-ETHINYL ESTRADIOL 3-0.02 MG PO TABS: 1.0000 | ORAL_TABLET | Freq: Every day | ORAL | 4 refills | Status: DC

## 2022-11-28 NOTE — Progress Notes (Signed)
   Kara Lloyd 10-May-2000 [id number]   History:  22 y.o. G0 presents for annual exam. Hx of PCOS, doing well on Nikki. 20+lb weight gain in the past year. Being worked up by PCP. Exercises 2-3times a week, lifts weights primarily. Eating more protein and more balanced meals. No gyn concerns.  Gynecologic History Patient's last menstrual period was 11/04/2022 (approximate). Period Cycle (Days): 28 Period Duration (Days): 3 Period Pattern: Regular Menstrual Flow: Light, Moderate, Heavy Menstrual Control: Tampon, Maxi pad, Thin pad Dysmenorrhea: (!) Moderate Dysmenorrhea Symptoms: Cramping Contraception/Family planning: OCP (estrogen/progesterone) Sexually active: yes   Obstetric History OB History  Gravida Para Term Preterm AB Living  0 0 0 0 0 0  SAB IAB Ectopic Multiple Live Births  0 0 0 0 0     The following portions of the patient's history were reviewed and updated as appropriate: allergies, current medications, past family history, past medical history, past social history, past surgical history, and problem list.  Review of Systems Pertinent items noted in HPI and remainder of comprehensive ROS otherwise negative.   Past medical history, past surgical history, family history and social history were all reviewed and documented in the EPIC chart.   Exam:  Vitals:   11/28/22 1035  BP: 118/82  Weight: 193 lb (87.5 kg)  Height: 5\' 5"  (1.651 m)   Body mass index is 32.12 kg/m.  General appearance:  Normal Thyroid:  Symmetrical, normal in size, without palpable masses or nodularity. Respiratory  Auscultation:  Clear without wheezing or rhonchi Cardiovascular  Auscultation:  Regular rate, without rubs, murmurs or gallops  Edema/varicosities:  Not grossly evident Abdominal  Soft,nontender, without masses, guarding or rebound.  Liver/spleen:  No organomegaly noted  Hernia:  None appreciated  Skin  Inspection:  Grossly normal Breasts: Examined lying and  sitting.   Right: Without masses, retractions, nipple discharge or axillary adenopathy.   Left: Without masses, retractions, nipple discharge or axillary adenopathy. Genitourinary   Inguinal/mons:  Normal without inguinal adenopathy  External genitalia:  Normal appearing vulva with no masses, tenderness, or lesions  BUS/Urethra/Skene's glands:  Normal without masses or exudate  Vagina:  Normal appearing with normal color and discharge, no lesions  Cervix:  Normal appearing without discharge or lesions  Uterus:  Normal in size, shape and contour.  Mobile, nontender  Adnexa/parametria:     Rt: Normal in size, without masses or tenderness.   Lt: Normal in size, without masses or tenderness.  Anus and perineum: Normal   Raynelle Fanning, CMA present for exam  Assessment/Plan:   1. Well female exam with routine gynecological exam - Cytology - PAP( [facility])  2. Screening for STDs (sexually transmitted diseases) - Cytology - PAP( [facility])  3. Encounter for surveillance of contraceptive pills - drospirenone-ethinyl estradiol (NIKKI) 3-0.02 MG tablet; Take 1 tablet by mouth daily.  Dispense: 84 tablet; Refill: 4   PCP completing workup for weight gain. Labs drawn this morning.  Discussed SBE, pap and STI screening as directed/appropriate. Recommend of exercise weekly, including weight bearing exercise. Encouraged the use of seatbelts and sunscreen. Return in 1 year for annual or as needed.   Arlie Solomons B WHNP-BC 11:12 AM 11/28/2022

## 2023-04-13 ENCOUNTER — Ambulatory Visit: Payer: BC Managed Care – PPO | Admitting: Radiology

## 2023-04-13 ENCOUNTER — Telehealth: Payer: Self-pay

## 2023-04-13 VITALS — BP 116/80 | HR 90 | Temp 97.7°F

## 2023-04-13 DIAGNOSIS — N6315 Unspecified lump in the right breast, overlapping quadrants: Secondary | ICD-10-CM

## 2023-04-13 NOTE — Telephone Encounter (Signed)
-----   Message from [place], [place] B sent at 04/13/2023 10:26 AM EST ----- Regarding: breast u/s Please schedule for right breast u/s tender mass at 9 oclock

## 2023-04-13 NOTE — Telephone Encounter (Signed)
Spoke w/ Scarlette Calico @ [facility] and got pt scheduled for 04/17/2023 @ 310.   Pt notified and voiced understanding. Will likely have to r/s due to procedure scheduled on 12/16.   Provided pt w/ phone number to call and r/s prn.  Routing to provider for final review and closing encounter.

## 2023-04-13 NOTE — Progress Notes (Signed)
   Kara Lloyd March 10, 2001 [id number]   History:  22 y.o. G0 c/o right breast 'knot' and pain x 1 week. No injury to the area, does not drink caffeine.   Gynecologic History Patient's last menstrual period was 04/13/2023 (exact date).   Contraception/Family planning: OCP (estrogen/progesterone)   Obstetric History OB History  Gravida Para Term Preterm AB Living  0 0 0 0 0 0  SAB IAB Ectopic Multiple Live Births  0 0 0 0 0     The following portions of the patient's history were reviewed and updated as appropriate: allergies, current medications, past family history, past medical history, past social history, past surgical history, and problem list.  Review of Systems Pertinent items noted in HPI and remainder of comprehensive ROS otherwise negative.   Past medical history, past surgical history, family history and social history were all reviewed and documented in the EPIC chart.   Exam:  Vitals:   04/13/23 1017  BP: 116/80  Pulse: 90  Temp: 97.7 F (36.5 C)  TempSrc: Oral  SpO2: 96%   There is no height or weight on file to calculate BMI.  Physical Exam Constitutional:      Appearance: Normal appearance. She is overweight.  Pulmonary:     Effort: Pulmonary effort is normal.  Chest:  Breasts:    Right: Mass and tenderness present.     Left: Normal.    Neurological:     Mental Status: She is alert.       Assessment/Plan:   1. Mass overlapping multiple quadrants of right breast (Primary) Diagnostic u/s ordered     Tanda Rockers WHNP-BC 10:28 AM 04/13/2023

## 2023-04-17 ENCOUNTER — Other Ambulatory Visit: Payer: BC Managed Care – PPO

## 2023-04-19 ENCOUNTER — Ambulatory Visit
Admission: RE | Admit: 2023-04-19 | Discharge: 2023-04-19 | Disposition: A | Discharge status: Home / Self Care | Payer: BC Managed Care – PPO | Source: Ambulatory Visit | Attending: Radiology | Admitting: Radiology

## 2023-04-19 DIAGNOSIS — N6315 Unspecified lump in the right breast, overlapping quadrants: Secondary | ICD-10-CM

## 2023-06-28 ENCOUNTER — Ambulatory Visit (INDEPENDENT_AMBULATORY_CARE_PROVIDER_SITE_OTHER): Payer: Self-pay | Admitting: Nurse Practitioner

## 2023-06-28 VITALS — BP 122/82 | HR 73 | Wt 191.0 lb

## 2023-06-28 DIAGNOSIS — B3731 Acute candidiasis of vulva and vagina: Secondary | ICD-10-CM | POA: Diagnosis not present

## 2023-06-28 DIAGNOSIS — N898 Other specified noninflammatory disorders of vagina: Secondary | ICD-10-CM

## 2023-06-28 LAB — WET PREP FOR TRICH, YEAST, CLUE

## 2023-06-28 MED ORDER — FLUCONAZOLE 150 MG PO TABS: 150.0000 mg | ORAL_TABLET | ORAL | 0 refills | Status: AC

## 2023-06-28 NOTE — Progress Notes (Signed)
   Acute Office Visit  Subjective:    Patient ID: Kara Lloyd, female    DOB: [birth year], 23 y.o.   MRN: [id number]   HPI 23 y.o. presents today for vaginal itching and discharge x 1 week. About to finish course of Augmentin for tooth ache. Long-term partner, declines STD screening.  Patient's last menstrual period was 05/21/2023.    Review of Systems  Constitutional: Negative.   Genitourinary:  Positive for vaginal discharge and vaginal pain (Itching).       Objective:    Physical Exam Constitutional:      Appearance: Normal appearance.  Genitourinary:    General: Normal vulva.     Vagina: Vaginal discharge and erythema present.     Cervix: Normal.     BP 122/82   Pulse 73   Wt 191 lb (86.6 kg)   LMP 05/21/2023   SpO2 100%   BMI 31.78 kg/m  Wt Readings from Last 3 Encounters:  06/28/23 191 lb (86.6 kg)  11/28/22 193 lb (87.5 kg)  11/16/21 136 lb (61.7 kg)        Patient informed chaperone available to be present for breast and/or pelvic exam. Patient has requested no chaperone to be present. Patient has been advised what will be completed during breast and pelvic exam.   Wet prep + yeast  Assessment & Plan:   Problem List Items Addressed This Visit   None Visit Diagnoses       Vaginal candidiasis    -  Primary   Relevant Medications   fluconazole (DIFLUCAN) 150 MG tablet     Vaginal itching       Relevant Orders   WET PREP FOR TRICH, YEAST, CLUE      Plan: Wet prep positive for yeast - Diflucan 150 mg every 3 days x 2 doses.   Return if symptoms worsen or fail to improve.    Olivia Mackie DNP, 2:10 PM 06/28/2023

## 2023-07-03 ENCOUNTER — Ambulatory Visit: Payer: Self-pay | Admitting: Radiology

## 2023-11-29 ENCOUNTER — Ambulatory Visit: Admitting: Radiology

## 2023-12-30 ENCOUNTER — Other Ambulatory Visit: Payer: Self-pay | Admitting: Radiology

## 2023-12-30 DIAGNOSIS — Z3041 Encounter for surveillance of contraceptive pills: Secondary | ICD-10-CM

## 2024-01-01 NOTE — Telephone Encounter (Signed)
 Med refill request: Kara Lloyd   Last AEX: 11/28/22 Next AEX: 01/23/24 Last MMG (if hormonal med) n/a Refill authorized: last rx 11/28/22 #84 with 4 refills. Please approve or deny

## 2024-01-23 ENCOUNTER — Ambulatory Visit: Admitting: Radiology

## 2024-02-19 ENCOUNTER — Encounter: Payer: Self-pay | Admitting: Radiology

## 2024-02-19 ENCOUNTER — Ambulatory Visit: Admitting: Radiology

## 2024-02-19 VITALS — BP 122/76 | HR 83 | Ht 65.0 in | Wt 165.0 lb

## 2024-02-19 DIAGNOSIS — Z3041 Encounter for surveillance of contraceptive pills: Secondary | ICD-10-CM

## 2024-02-19 DIAGNOSIS — Z01419 Encounter for gynecological examination (general) (routine) without abnormal findings: Secondary | ICD-10-CM | POA: Diagnosis not present

## 2024-02-19 DIAGNOSIS — Z1331 Encounter for screening for depression: Secondary | ICD-10-CM

## 2024-02-19 MED ORDER — DROSPIRENONE-ETHINYL ESTRADIOL 3-0.02 MG PO TABS: 1.0000 | ORAL_TABLET | Freq: Every day | ORAL | 4 refills | Status: AC

## 2024-02-19 NOTE — Patient Instructions (Signed)

## 2024-02-19 NOTE — Progress Notes (Signed)
 Kara Lloyd July 30, 2000 [id number]   History:  23 y.o. G0 presents for annual exam.Missed 3-4 COC's last month (patient took 2 pills one day, then one daily). Has had some irregular bleeding since.  Gynecologic History Patient's last menstrual period was 02/19/2024 (approximate). Period Cycle (Days): 28 Period Duration (Days): 4-5 Period Pattern: Regular Menstrual Flow: Moderate, Heavy Menstrual Control: Thin pad, Maxi pad, Tampon Dysmenorrhea: (!) Moderate Dysmenorrhea Symptoms: Cramping Contraception/Family planning: OCP (estrogen/progesterone) Sexually active: yes Last Pap: 7/24. Results were: normal  Obstetric History OB History  Gravida Para Term Preterm AB Living  0 0 0 0 0 0  SAB IAB Ectopic Multiple Live Births  0 0 0 0 0       02/19/2024    1:27 PM  Depression screen PHQ 2/9  Decreased Interest 1  Down, Depressed, Hopeless 1  PHQ - 2 Score 2  Altered sleeping 2  Tired, decreased energy 2  Change in appetite 0  Feeling bad or failure about yourself  1  Trouble concentrating 1  Moving slowly or fidgety/restless 0  Suicidal thoughts 0  PHQ-9 Score 8  Difficult doing work/chores Very difficult     The following portions of the patient's history were reviewed and updated as appropriate: allergies, current medications, past family history, past medical history, past social history, past surgical history, and problem list.  Review of Systems  All other systems reviewed and are negative.   Past medical history, past surgical history, family history and social history were all reviewed and documented in the EPIC chart.  Exam:  Vitals:   02/19/24 1324  BP: 122/76  Pulse: 83  SpO2: 98%  Weight: 165 lb (74.8 kg)  Height: 5' 5 (1.651 m)   Body mass index is 27.46 kg/m.  Physical Exam Vitals and nursing note reviewed. Exam conducted with a chaperone present.  Constitutional:      Appearance: Normal appearance. She is normal weight.  HENT:      Head: Normocephalic and atraumatic.  Neck:     Thyroid : No thyroid  mass, thyromegaly or thyroid  tenderness.  Cardiovascular:     Rate and Rhythm: Regular rhythm.     Heart sounds: Normal heart sounds.  Pulmonary:     Effort: Pulmonary effort is normal.     Breath sounds: Normal breath sounds.  Chest:  Breasts:    Breasts are symmetrical.     Right: Normal. No inverted nipple, mass, nipple discharge, skin change or tenderness.     Left: Normal. No inverted nipple, mass, nipple discharge, skin change or tenderness.  Abdominal:     General: Abdomen is flat. Bowel sounds are normal.     Palpations: Abdomen is soft.  Genitourinary:    General: Normal vulva.     Vagina: Normal. No vaginal discharge, bleeding or lesions.     Cervix: Normal. No discharge or lesion.     Uterus: Normal. Not enlarged and not tender.      Adnexa: Right adnexa normal and left adnexa normal.       Right: No mass, tenderness or fullness.         Left: No mass, tenderness or fullness.    Lymphadenopathy:     Upper Body:     Right upper body: No axillary adenopathy.     Left upper body: No axillary adenopathy.  Skin:    General: Skin is warm and dry.  Neurological:     Mental Status: She is alert and oriented to person, place, and time.  Psychiatric:        Mood and Affect: Mood normal.        Thought Content: Thought content normal.        Judgment: Judgment normal.      Darice Hoit, CMA present for exam  Assessment/Plan:   1. Well female exam with routine gynecological exam (Primary) Pap 2027  2. Encounter for surveillance of contraceptive pills - drospirenone -ethinyl estradiol  (NIKKI ) 3-0.02 MG tablet; Take 1 tablet by mouth daily.  Dispense: 84 tablet; Refill: 4  3. Depression screen  Managed by another provider  Return in about 1 year (around 02/18/2025) for Annual.  GINETTE COZIER B WHNP-BC 1:58 PM 02/19/2024

## 2024-03-19 ENCOUNTER — Ambulatory Visit: Admitting: Radiology
